# Patient Record
Sex: Female | Born: 1945
Health system: Southern US, Community
[De-identification: ages and names within clinical notes are randomized; demographics above are authoritative.]

## PROBLEM LIST (undated history)

## (undated) DIAGNOSIS — Z8619 Personal history of other infectious and parasitic diseases: Secondary | ICD-10-CM

## (undated) DIAGNOSIS — K5792 Diverticulitis of intestine, part unspecified, without perforation or abscess without bleeding: Secondary | ICD-10-CM

## (undated) HISTORY — DX: Personal history of other infectious and parasitic diseases: Z86.19

## (undated) HISTORY — PX: CHOLECYSTECTOMY: SHX55

## (undated) HISTORY — DX: Diverticulitis of intestine, part unspecified, without perforation or abscess without bleeding: K57.92

## (undated) HISTORY — PX: APPENDECTOMY: SHX54

---

## 1974-04-30 HISTORY — PX: ABDOMINAL HYSTERECTOMY: SHX81

## 1998-04-30 HISTORY — PX: FOOT SURGERY: SHX648

## 2002-04-30 HISTORY — PX: KNEE SURGERY: SHX244

## 2004-02-01 ENCOUNTER — Ambulatory Visit: Payer: Self-pay

## 2004-05-11 ENCOUNTER — Ambulatory Visit: Payer: Self-pay | Admitting: Specialist

## 2005-02-06 ENCOUNTER — Ambulatory Visit: Payer: Self-pay | Admitting: Family Medicine

## 2006-02-26 ENCOUNTER — Ambulatory Visit: Payer: Self-pay | Admitting: Family Medicine

## 2010-04-26 ENCOUNTER — Ambulatory Visit: Payer: Self-pay | Admitting: Family Medicine

## 2011-04-11 LAB — HM PAP SMEAR: HM Pap smear: NORMAL

## 2011-05-31 ENCOUNTER — Ambulatory Visit: Payer: Self-pay | Admitting: Family Medicine

## 2012-05-08 ENCOUNTER — Ambulatory Visit: Payer: Self-pay | Admitting: Family Medicine

## 2012-07-18 ENCOUNTER — Ambulatory Visit: Payer: Self-pay | Admitting: Family Medicine

## 2013-01-09 ENCOUNTER — Ambulatory Visit: Payer: Self-pay | Admitting: Gastroenterology

## 2013-01-09 HISTORY — PX: UPPER GI ENDOSCOPY: SHX6162

## 2013-01-09 HISTORY — PX: COLONOSCOPY: SHX174

## 2013-01-09 LAB — HM COLONOSCOPY

## 2013-01-12 LAB — PATHOLOGY REPORT

## 2013-01-16 ENCOUNTER — Ambulatory Visit: Payer: Self-pay | Admitting: Gastroenterology

## 2013-01-23 ENCOUNTER — Ambulatory Visit: Payer: Self-pay | Admitting: Gastroenterology

## 2013-02-03 ENCOUNTER — Encounter: Payer: Self-pay | Admitting: *Deleted

## 2013-02-18 ENCOUNTER — Ambulatory Visit (INDEPENDENT_AMBULATORY_CARE_PROVIDER_SITE_OTHER): Payer: Medicare Other | Admitting: General Surgery

## 2013-02-18 ENCOUNTER — Encounter: Payer: Self-pay | Admitting: General Surgery

## 2013-02-18 VITALS — BP 128/80 | HR 60 | Resp 16 | Ht 63.0 in | Wt 201.0 lb

## 2013-02-18 DIAGNOSIS — K5732 Diverticulitis of large intestine without perforation or abscess without bleeding: Secondary | ICD-10-CM

## 2013-02-18 DIAGNOSIS — K5792 Diverticulitis of intestine, part unspecified, without perforation or abscess without bleeding: Secondary | ICD-10-CM

## 2013-02-18 NOTE — Patient Instructions (Addendum)
The patient is aware to call back for any questions or concerns.  

## 2013-02-18 NOTE — Progress Notes (Signed)
Patient ID: Olivia [name], female   DOB: 1945/12/25, 67 y.o.   MRN: [id number]  Chief Complaint  Patient presents with  . Other    abnormal CT    HPI Olivia [name] is a 67 y.o. female.  Here today for evaluation of abnormal CT scan with abdominal pain referred by Dr. Ricki Rodriguez.  States that in January she had abdominal pain ( lower abdomen) that lasted about 7 days and then went away but that it started again in June. The pain was associated with vomiting during the July episode. She is presently asymptomatic.  She elected to seek GI evaluation after the second episode in the event something serious was going on.   States her bowels move regularly after each meal. No constipation or blood noted with bowel movements. Denies episodes of pain since June only occasional "cramp". She states she was treated for diverticulitis in 2007 but no test were done. Lost 25 pounds since September. She does states she has noticed more flatus since colonoscopy.  While she denies frank food avoidance, on further questioning she has been somewhat cautious with her dietary intake.  HPI  Past Medical History  Diagnosis Date  . Arthritis   . Hemorrhoid   . Diverticulosis 2014    Past Surgical History  Procedure Laterality Date  . Abdominal hysterectomy  1976  . Cholecystectomy  2003 ?  Marland Kitchen Foot surgery Right 2000  . Knee surgery Right 2004  . Colonoscopy  01-09-13    Dr Ricki Rodriguez  . Upper gi endoscopy  01-09-13    Dr Ricki Rodriguez  . Appendectomy      Family History  Problem Relation Age of Onset  . Alzheimer's disease Father     Social History History  Substance Use Topics  . Smoking status: Never Smoker   . Smokeless tobacco: Never Used  . Alcohol Use: Yes    No Known Allergies  Current Outpatient Prescriptions  Medication Sig Dispense Refill  . meloxicam (MOBIC) 15 MG tablet Take 15 mg by mouth daily.      . Multiple Vitamin (MULTIVITAMIN) capsule Take 1 capsule by mouth daily.      . polycarbophil  (FIBERCON) 625 MG tablet Take 625 mg by mouth daily.      . Probiotic Product (PHILLIPS COLON HEALTH PO) Take 1 tablet by mouth daily.      Marland Kitchen omeprazole (PRILOSEC) 20 MG capsule Take 20 mg by mouth daily.       . pantoprazole (PROTONIX) 40 MG tablet Take 40 mg by mouth 2 (two) times daily.        No current facility-administered medications for this visit.    Review of Systems Review of Systems  Constitutional: Negative.   Respiratory: Negative.   Cardiovascular: Negative.   Gastrointestinal: Positive for nausea, vomiting and abdominal pain. Negative for blood in stool.    Blood pressure 128/80, pulse 60, resp. rate 16, height 5\' 3"  (1.6 m), weight 201 lb (91.173 kg).  Physical Exam Physical Exam  Constitutional: She is oriented to person, place, and time. She appears well-developed and well-nourished.  Eyes: Conjunctivae are normal. No scleral icterus.  Neck: Neck supple.  Cardiovascular: Normal rate, regular rhythm and normal heart sounds.   No lower leg edema noted.  Pulmonary/Chest: Effort normal and breath sounds normal.  Abdominal: Soft. Bowel sounds are normal. She exhibits no mass. There is no tenderness.  Lymphadenopathy:    She has no cervical adenopathy.  Neurological: She is alert and oriented to person, place,  and time.  Skin: Skin is warm and dry.    Data Reviewed Vevelyn Pat,  NP notes from August 2014 during her initial evaluation. Weight loss was noted at that time.  Laboratory studies dated 12/01/2012 showed a hemoglobin of 12.2 with an MCV of 93, white blood cell count 6200 with a normal differential, platelet count 237,000. Normal liver function studies, albumin 4.0, normal renal function, estimated GFR greater than 60.  Upper endoscopy dated 02/08/2013 showed gastritis with the clean ulcer base in the stomach.  Pathology: Diagnosis: Part A: ANTRAL AND BODY OF STOMACH COLD BIOSPY: - ANTRAL MUCOSA WITH MINIMAL CHRONIC GASTRITIS AND  SUPERFICIAL VASCULAR CONGESTION WITH REACTIVE FOVEOLAR HYPERPLASIA (SEE COMMENT). - OXYNTIC MUCOSA WITH MINIMAL CHRONIC GASTRITIS. - NO HELICOBACTER PYLORI ARE IDENTIFIED ON HEMATOXYLIN AND EOSIN STAIN. - NEGATIVE FOR DYSPLASIA AND MALIGNANCY. . Part B: GEJ COLD BIOPSY: - GASTRIC TYPE MUCOSA WITH MILD TO MODERATE CHRONIC INFLAMMATION. - NO INTESTINAL METAPLASIA, DYSPLASIA OR MALIGNANCY IDENTIFIED. Marland Kitchen Comment: Findings the in antral mucosa are compatible with healing mucosal injury.  Possible etiologies include drug, chemical injury, infection and other upper GI process. Correlation with clinical and endoscopic findings is recommended.  Colonoscopy of the same date showed mild diverticulosis in the sigmoid colon with petechiae visualized at the diverticular opening. Passage of the sigmoid was not possible secondary to angulation.  Barium enema dated 01/16/2013, air contrast, her diffuse severe diverticulosis within the sigmoid colon and a lesser extent in the ascending colon. No luminal narrowing was identified in the midportion of the sigmoid colon. Irregularity in the wall was noted to distention of the colon was appreciated in this area differential diagnoses consisted of diverticulitis versus a mass.  CT scan of the abdomen and pelvis dated 01/23/2013 showed abnormal appearance of the sigmoid colon with thickening of the wall and narrowing of the lumen. This is poorly seen due to artifact from the previous administered barium in diverticuli in this region to underlying malignancy or chronic inflammation was thought to be most likely source of this finding.  The patient reports that his CEA had been drawn, but these results are not available.    Assessment    Diverticulosis/history diverticulitis; abnormal imaging.      Plan    The degree of thickening of the bowel wall and the sigmoid colon seems out of proportion for the patient's clinical history for 2014. The fairly uniform  segmental wall thickening would be typically more common with an inflammatory process rather than malignancy. The proximal dilatation is scant.  The patient is presently entirely asymptomatic, and the only true concern is her history of weight loss in the absence of any other clinical indicator of a pathologic process.  I would plan to repeat her CT in December, 2014 if she remains asymptomatic and the CEA obtained earlier this month is normal. Repeat attempts at visualization of the sigmoid colon would be considered based on the results of the followup CT scan.  The patient will be contacted with this plan and arrangements made for repeat scan in December.  She is encouraged to call this office should she develop any recurrent episodes of abdominal pain.     CEA levels were done at [facility] last week.   Earline Mayotte 02/20/2013, 4:37 PM

## 2013-02-20 ENCOUNTER — Telehealth: Payer: Self-pay | Admitting: General Surgery

## 2013-02-20 ENCOUNTER — Encounter: Payer: Self-pay | Admitting: General Surgery

## 2013-02-20 DIAGNOSIS — K5792 Diverticulitis of intestine, part unspecified, without perforation or abscess without bleeding: Secondary | ICD-10-CM

## 2013-02-20 HISTORY — DX: Diverticulitis of intestine, part unspecified, without perforation or abscess without bleeding: K57.92

## 2013-02-20 NOTE — Telephone Encounter (Signed)
Left a message for the patient that we're still waiting for some final pieces of information from the referring physician's office.

## 2013-02-23 ENCOUNTER — Encounter: Payer: Self-pay | Admitting: *Deleted

## 2013-02-23 ENCOUNTER — Telehealth: Payer: Self-pay | Admitting: *Deleted

## 2013-02-23 DIAGNOSIS — K5732 Diverticulitis of large intestine without perforation or abscess without bleeding: Secondary | ICD-10-CM

## 2013-02-23 NOTE — Telephone Encounter (Signed)
Patient has been scheduled for a CT abdomen/pelvis with contrast at [facility] for 04-02-13 at 1 pm (arrive 12:45 pm). Prep: no solids 4 hours prior but patient may have clear liquids up until exam time, pick up prep kit, and take medication list. Patient verbalizes understanding.  She will follow up in the office on 04-09-13 at 10 am.

## 2013-02-23 NOTE — Telephone Encounter (Signed)
Message copied by Nicholes Mango on Mon Feb 23, 2013 11:58 AM ------      Message from: [place], Merrily Pew      Created: Fri Feb 20, 2013  4:49 PM       Arrange for CT abd and pelvis; f/u diverticulitis in Dec 2014 w/ OV to follow.        ------

## 2013-03-23 ENCOUNTER — Telehealth: Payer: Self-pay

## 2013-03-23 NOTE — Telephone Encounter (Signed)
Patient called and would like to know if we could call her in a refill of her Protonix 40 mg. She does not have enough to last until her follow up appointment with [place] on the 15th of December. She is aware that this may not be taken care of until the 1st of December. She said she has enough to last until December 5th.

## 2013-03-30 ENCOUNTER — Telehealth: Payer: Self-pay

## 2013-03-30 NOTE — Telephone Encounter (Signed)
Patient notified that she would have to have Dr Orpah Cobb office refill her prescription for Protonix. She is aware and will contact them for this.

## 2013-03-30 NOTE — Telephone Encounter (Signed)
Message copied by Sinda Du on Mon Mar 30, 2013  8:44 AM ------      Message from: [place], Merrily Pew      Created: Mon Mar 30, 2013  8:19 AM       Original RX for Protonix was from another office. She should contact that office to arrange for refill. Thanks.  ------

## 2013-04-02 ENCOUNTER — Ambulatory Visit: Payer: Self-pay | Admitting: General Surgery

## 2013-04-03 ENCOUNTER — Encounter: Payer: Self-pay | Admitting: General Surgery

## 2013-04-09 ENCOUNTER — Ambulatory Visit (INDEPENDENT_AMBULATORY_CARE_PROVIDER_SITE_OTHER): Payer: Medicare Other | Admitting: General Surgery

## 2013-04-09 ENCOUNTER — Ambulatory Visit: Payer: Medicare Other | Admitting: General Surgery

## 2013-04-09 VITALS — BP 140/78 | HR 78 | Resp 14 | Ht 63.0 in | Wt 203.0 lb

## 2013-04-09 DIAGNOSIS — K5732 Diverticulitis of large intestine without perforation or abscess without bleeding: Secondary | ICD-10-CM

## 2013-04-09 NOTE — Progress Notes (Signed)
Patient ID: Elantra Caprara, female   DOB: 1946-01-19, 67 y.o.   MRN: [id number]  Chief Complaint  Patient presents with  . Follow-up    HPI Lashica Hannay is a 67 y.o. female  Here for a follow up from a CT of the abdomen and pelvis. She states that she is doing very well. The patient reports that in the past she would have a stool after each meal. This was not associated with any pain and a formed stool was noted. Since her recent lower abdominal discomfort, she will still have a bowel movement after each meal, although this may occur up to an hour later. The stools are somewhat less formed, although certainly not loose. No mucus or blood reported.  Her energy level is good. Her weight is up to #surgeon in October. HPI  Past Medical History  Diagnosis Date  . Arthritis   . Hemorrhoid   . Diverticulosis 2014    Past Surgical History  Procedure Laterality Date  . Abdominal hysterectomy  1976  . Cholecystectomy  2003 ?  Marland Kitchen Foot surgery Right 2000  . Knee surgery Right 2004  . Colonoscopy  01-09-13    Dr Ricki Rodriguez  . Upper gi endoscopy  01-09-13    Dr Ricki Rodriguez  . Appendectomy      Family History  Problem Relation Age of Onset  . Alzheimer's disease Father     Social History History  Substance Use Topics  . Smoking status: Never Smoker   . Smokeless tobacco: Never Used  . Alcohol Use: Yes    No Known Allergies  Current Outpatient Prescriptions  Medication Sig Dispense Refill  . meloxicam (MOBIC) 15 MG tablet Take 15 mg by mouth daily.      . Multiple Vitamin (MULTIVITAMIN) capsule Take 1 capsule by mouth daily.      Marland Kitchen omeprazole (PRILOSEC) 20 MG capsule Take 20 mg by mouth daily.       . polycarbophil (FIBERCON) 625 MG tablet Take 625 mg by mouth daily.      . Probiotic Product (PHILLIPS COLON HEALTH PO) Take 1 tablet by mouth daily.      . pantoprazole (PROTONIX) 40 MG tablet Take 40 mg by mouth 2 (two) times daily.        No current facility-administered medications for  this visit.    Review of Systems Review of Systems  Constitutional: Negative.   Respiratory: Negative.   Cardiovascular: Negative.     Blood pressure 140/78, pulse 78, resp. rate 14, height 5\' 3"  (1.6 m), weight 203 lb (92.08 kg).  Physical Exam Physical Exam  Constitutional: She is oriented to person, place, and time. She appears well-developed and well-nourished.  Neck: Neck supple.  Pulmonary/Chest: Effort normal and breath sounds normal.  Abdominal: Soft. Normal appearance and bowel sounds are normal. There is no hepatosplenomegaly. There is no tenderness. No hernia.    Lymphadenopathy:    She has no cervical adenopathy.  Neurological: She is alert and oriented to person, place, and time.    Data Reviewed CT scan completed April 02, 2013 was reviewed and compared to her January 23, 2013 exam. Significant findings are confined to the sigmoid where there is abnormal circumferential wall thickening. No lymphadenopathy. Small hiatal hernia with mild distal esophageal wall thickening.  A CT as well as the barium enema completed prior to her assessment here in October 2014 was reviewed with the patient did show her the luminal narrowing of the period  Assessment    Likely  chronic diverticulitis with a fibrosis and scarring accounting for the CT findings.  The possibility of malignancy cannot be excluded based on imaging today.     Plan    CEA was previously obtained was normal. The area could not be visualized on colonoscopy by Dr. Marva Panda secondary to tortuosity in the area. Very minimal a showed no focal abnormality, but a significant decrease in diameter. There is really no proximal bowel dilatation to suggest obstruction.  My main concern is with a significant luminal decrease, unchanged over the past 3 months the patient may be prone to frank obstruction should she develop another episode of diverticulitis in this segment of colon studded with diverticuli. Elective  resection of this area was reviewed. The risks associated with surgery including those related to bleeding, infection, leakage, the need for a temporary colostomy were all reviewed. An informational brochure was provided  The patient consider her options notify the office of how she would like to proceed. The opportunity for second surgical opinion was discussed. She may want to review in person with Dr. Marva Panda these recommendations.        Earline Mayotte 04/09/2013, 8:09 PM

## 2013-04-09 NOTE — Patient Instructions (Signed)
Open Colectomy An open colectomy is surgery to take out part or all of the large intestine (colon).  LET YOUR HEALTH CARE PROVIDER KNOW ABOUT:  Any allergies you have.  All medicines you are taking, including vitamins, herbs, eyedrops, creams, and over-the-counter medicines.  Previous problems you or members of your family have had with the use of anesthetics.  Any blood disorders you have.  Previous surgeries you have had.  Medical conditions you have.  Possibility of pregnancy, if this applies.  Any recent infections or colds.  Smoking history. RISKS AND COMPLICATIONS  Generally, this is a safe procedure. However, as with any procedure, complications can occur. Possible complications include:  An infection developing in the area where the surgery was done.  Problems with the incisions including:  Bleeding from an incision.  The wound reopening.  Tissues from inside the abdomen bulging through the incision (hernia).  Bleeding inside the abdomen.  Reopening of the colon where it was stitched or stapled together. This is a serious complication. Another procedure may be needed to fix the problem.  Damage to other organs in the abdomen.  A blood clot forming in a vein and traveling to the lungs.  Future blockage of the colon. BEFORE THE PROCEDURE  A medical evaluation will be done. This may include:  A physical exam.  Blood tests.  A test to check the heart's rhythm (electrocardiogram).  An MRI. This can take pictures of the colon. An MRI uses a magnet, radio waves, and a computer to create a picture of your colon.  Talking with the person who will be in charge of the medicine during the procedure. An open colon resection requires general anesthesia. Ask what you can expect.  Two weeks before the surgery, stop using aspirin and nonsteroidal anti-inflammatory drugs (NSAIDs) for pain relief. This includes prescription drugs and over-the-counter drugs. Also stop  taking vitamin E.  If you take blood thinners, ask your health care provider when you should stop taking them.  Do not eat or drink anything for 8 12 hours before the surgery. Ask your health care provider if it is okay to take any needed medicines with a sip of water.  Ask your health care provider if you need to arrive early before the procedure.  On the day of your surgery, your health care provider will need to know the last time you had anything to eat or drink. This includes water, gum, and candy.  Make arrangements in advance for someone to drive you home. PROCEDURE Colon resection can take 1 4 hours.  Small monitors will be put on your body. They are used to check your heart, blood pressure, and oxygen level.  You will be given an intravenous line (IV). A needle will be inserted in your arm. Medicine will be able to flow directly into your body through this needle.  You might be given a medicine to help you relax (sedative).  You will be given a general anesthetic.  A tube may be put in through your nose. It is called a nasogastric tube. It is used to remove stomach juices after surgery until the intestines start working again.  Once you are asleep, the surgeon will make an incision in the abdomen about 6 12 inches long.  Clamps are put on both ends of the diseased part of the colon.  The part of the intestine between the clamps is removed.  If possible, the ends of the healthy colon that remain will be stitched   or stapled together.  Sometimes, a colostomy is needed. For a colostomy:  An opening (stoma) to the outside is made through the abdomen.  The end of the colon is brought through the opening. It is stitched to the skin.  A bag is attached to the opening. Waste will drain into this bag. The bag is removable.  The colostomy can be temporary or permanent. Ask your surgeon what to expect.  The incision from the colon resection will be closed with stitches or  staples. AFTER THE PROCEDURE  You will stay in a recovery area until the anesthesia has worn off. Your blood pressure and pulse will be checked every so often. Then you will be taken to a hospital room.  You will continue to get fluids through the IV for awhile. The IV will be taken out when the colon starts working again.  You will gradually go back to a normal diet.  Some pain is normal after a colon resection. Ask for pain medicine if the pain becomes too much.  You will be urged to get up and start walking after 1 or 2 days, at the most.  If you had a colostomy, your health care provider will explain how it works and what you will need to do.  Most people spend 3 7 days in the hospital after this surgery. Ask your health care provider what to expect. Document Released: 02/11/2009 Document Revised: 12/17/2012 Document Reviewed: 09/01/2010 [employer] Patient Information 2014 [employer].  

## 2013-04-13 ENCOUNTER — Ambulatory Visit: Payer: Medicare Other | Admitting: General Surgery

## 2013-05-05 ENCOUNTER — Telehealth: Payer: Self-pay | Admitting: *Deleted

## 2013-05-05 DIAGNOSIS — K5732 Diverticulitis of large intestine without perforation or abscess without bleeding: Secondary | ICD-10-CM

## 2013-05-05 MED ORDER — NEOMYCIN SULFATE 500 MG PO TABS: ORAL_TABLET | ORAL | Status: DC

## 2013-05-05 MED ORDER — POLYETHYLENE GLYCOL 3350 17 GM/SCOOP PO POWD: ORAL | Status: DC

## 2013-05-05 MED ORDER — METRONIDAZOLE 500 MG PO TABS: ORAL_TABLET | ORAL | Status: DC

## 2013-05-05 NOTE — Telephone Encounter (Signed)
Patient's surgery has been scheduled for 05-15-13 at [facility].  Further instructions and bowel prep will need to be reviewed with patient at pre-op visit. Prescription for bowel prep has been sent in to her pharmacy.  It is okay for patient to continue Meloxicam pre-op.

## 2013-05-07 ENCOUNTER — Ambulatory Visit (INDEPENDENT_AMBULATORY_CARE_PROVIDER_SITE_OTHER): Payer: Medicare Other | Admitting: General Surgery

## 2013-05-07 ENCOUNTER — Encounter: Payer: Self-pay | Admitting: General Surgery

## 2013-05-07 ENCOUNTER — Other Ambulatory Visit: Payer: Self-pay | Admitting: General Surgery

## 2013-05-07 ENCOUNTER — Ambulatory Visit: Payer: Self-pay | Admitting: General Surgery

## 2013-05-07 VITALS — BP 152/82 | HR 80 | Resp 14 | Ht 67.0 in | Wt 203.0 lb

## 2013-05-07 DIAGNOSIS — K5732 Diverticulitis of large intestine without perforation or abscess without bleeding: Secondary | ICD-10-CM

## 2013-05-07 DIAGNOSIS — Z01810 Encounter for preprocedural cardiovascular examination: Secondary | ICD-10-CM

## 2013-05-07 NOTE — Patient Instructions (Addendum)
The patient is aware to call back for any questions or concerns. Monostat for current fungal/yeast rash use twice a day.

## 2013-05-07 NOTE — Progress Notes (Signed)
Patient ID: Olivia [name], female   DOB: 1946-04-19, 68 y.o.   MRN: [id number]  Chief Complaint  Patient presents with  . Pre-op Exam    colon resection    HPI Daris Aristizabal is a 68 y.o. female.  Here today for pre op colon resection scheduled for 05-15-13. States she may have a little sinus drainage today. HPI  Past Medical History  Diagnosis Date  . Arthritis   . Hemorrhoid   . Diverticulosis 2014    Past Surgical History  Procedure Laterality Date  . Abdominal hysterectomy  1976  . Cholecystectomy  2003 ?  Marland Kitchen Foot surgery Right 2000  . Knee surgery Right 2004  . Colonoscopy  01-09-13    Dr Donnella Sham  . Upper gi endoscopy  01-09-13    Dr Donnella Sham  . Appendectomy      Family History  Problem Relation Age of Onset  . Alzheimer's disease Father     Social History History  Substance Use Topics  . Smoking status: Never Smoker   . Smokeless tobacco: Never Used  . Alcohol Use: Yes    No Known Allergies  Current Outpatient Prescriptions  Medication Sig Dispense Refill  . Ascorbic Acid (VITAMIN C) 100 MG tablet Take 100 mg by mouth daily.      . meloxicam (MOBIC) 15 MG tablet Take 15 mg by mouth daily.      . metroNIDAZOLE (FLAGYL) 500 MG tablet Take (1) one tablet at 6 pm and (1) one tablet at 11 pm the evening prior to surgery.  2 tablet  0  . Multiple Vitamin (MULTIVITAMIN) capsule Take 1 capsule by mouth daily.      Marland Kitchen neomycin (MYCIFRADIN) 500 MG tablet Take (2) tablets at 6 pm and (2) tablets at 11 pm the evening prior to surgery.  4 tablet  0   No current facility-administered medications for this visit.    Review of Systems Review of Systems  Constitutional: Negative.   Respiratory: Negative.   Cardiovascular: Negative.     Blood pressure 152/82, pulse 80, resp. rate 14, height 5\' 7"  (1.702 m), weight 203 lb (92.08 kg).  Physical Exam Physical Exam  Constitutional: She is oriented to person, place, and time. She appears well-developed and well-nourished.   Eyes: No scleral icterus.  Neck: Neck supple.  Cardiovascular: Normal rate and regular rhythm.   Murmur heard.  Systolic murmur is present with a grade of 1/6  Over aortic outflow tract, not previously noted.   Pulmonary/Chest: Effort normal and breath sounds normal.  Abdominal: Soft. Normal appearance and bowel sounds are normal. There is no tenderness. A hernia is present.  Well healed lower midline abdominal incision. Small rash suprapubic region.  Lymphadenopathy:    She has no cervical adenopathy.  Neurological: She is alert and oriented to person, place, and time.  Skin: Skin is warm and dry.    Data Reviewed December 2014 CT shows persistent narrowing of the mid sigmoid colon.  Assessment    Likely chronic diverticulitis with scarring versus invasive malignancy    Plan    Discussed risk and benefits associated with colon resection. Monostat for current fungal/yeast rash in the suprapubic area to be used twice a day. Instructions for preparation prior surgery were reviewed with the patient.        Robert Bellow 05/07/2013, 11:31 AM

## 2013-05-15 ENCOUNTER — Inpatient Hospital Stay: Payer: Self-pay | Admitting: General Surgery

## 2013-05-15 DIAGNOSIS — K5732 Diverticulitis of large intestine without perforation or abscess without bleeding: Secondary | ICD-10-CM

## 2013-05-15 HISTORY — PX: COLON SURGERY: SHX602

## 2013-05-16 LAB — CREATININE, SERUM
Creatinine: 0.81 mg/dL (ref 0.60–1.30)
EGFR (African American): >60
EGFR (Non-African Amer.): >60

## 2013-05-18 ENCOUNTER — Encounter: Payer: Self-pay | Admitting: General Surgery

## 2013-05-18 LAB — PATHOLOGY REPORT

## 2013-05-19 ENCOUNTER — Encounter: Payer: Self-pay | Admitting: General Surgery

## 2013-05-19 LAB — WOUND CULTURE

## 2013-05-22 ENCOUNTER — Ambulatory Visit (INDEPENDENT_AMBULATORY_CARE_PROVIDER_SITE_OTHER): Payer: Medicare Other | Admitting: *Deleted

## 2013-05-22 DIAGNOSIS — K5732 Diverticulitis of large intestine without perforation or abscess without bleeding: Secondary | ICD-10-CM

## 2013-05-22 NOTE — Progress Notes (Signed)
The staples were removed and steri strips applied.Area looks clean .

## 2013-05-27 ENCOUNTER — Encounter: Payer: Self-pay | Admitting: General Surgery

## 2013-05-27 ENCOUNTER — Ambulatory Visit (INDEPENDENT_AMBULATORY_CARE_PROVIDER_SITE_OTHER): Payer: Medicare Other | Admitting: General Surgery

## 2013-05-27 VITALS — BP 144/68 | HR 84 | Resp 14 | Ht 63.0 in | Wt 194.0 lb

## 2013-05-27 DIAGNOSIS — K5732 Diverticulitis of large intestine without perforation or abscess without bleeding: Secondary | ICD-10-CM

## 2013-05-27 NOTE — Progress Notes (Signed)
Patient ID: Olivia [name], female   DOB: 02-16-1946, 68 y.o.   MRN: [id number]  Chief Complaint  Patient presents with  . Routine Post Op    colon resection     HPI Olivia [name] is a 68 y.o. female who presents for a post op colon resection. The procedure was performed on 05/15/13. The patient states she is doing well. Her bowels are starting to be more formed at this time.  Bowel movements are a little bit irregular, occasional loose stools. No blood or mucus. No dietary intolerance. Minimal abdominal soreness.  HPI  Past Medical History  Diagnosis Date  . Arthritis   . Hemorrhoid   . Diverticulosis 2014    Past Surgical History  Procedure Laterality Date  . Abdominal hysterectomy  1976  . Cholecystectomy  2003 ?  Olivia [name] Foot surgery Right 2000  . Knee surgery Right 2004  . Colonoscopy  01-09-13    Dr Donnella Sham  . Upper gi endoscopy  01-09-13    Dr Donnella Sham  . Appendectomy    . Colon surgery  05/15/13    Sigmoid colectomy for focal diverticulosis/diverticulitis with contained abscess.    Family History  Problem Relation Age of Onset  . Alzheimer's disease Father     Social History History  Substance Use Topics  . Smoking status: Never Smoker   . Smokeless tobacco: Never Used  . Alcohol Use: Yes    No Known Allergies  Current Outpatient Prescriptions  Medication Sig Dispense Refill  . Ascorbic Acid (VITAMIN C) 100 MG tablet Take 100 mg by mouth daily.      . meloxicam (MOBIC) 15 MG tablet Take 15 mg by mouth daily.      . metroNIDAZOLE (FLAGYL) 500 MG tablet Take (1) one tablet at 6 pm and (1) one tablet at 11 pm the evening prior to surgery.  2 tablet  0  . Multiple Vitamin (MULTIVITAMIN) capsule Take 1 capsule by mouth daily.      Olivia [name] neomycin (MYCIFRADIN) 500 MG tablet Take (2) tablets at 6 pm and (2) tablets at 11 pm the evening prior to surgery.  4 tablet  0   No current facility-administered medications for this visit.    Review of Systems Review of Systems   Constitutional: Negative.   Respiratory: Negative.   Cardiovascular: Negative.   Gastrointestinal: Negative.     Blood pressure 144/68, pulse 84, resp. rate 14, height 5\' 3"  (1.6 m), weight 194 lb (87.998 kg).  Physical Exam Physical Exam  Constitutional: She is oriented to person, place, and time. She appears well-developed and well-nourished.  Cardiovascular: Normal rate, regular rhythm and normal heart sounds.   No murmur heard. Pulmonary/Chest: Effort normal and breath sounds normal.  Abdominal: Soft. Normal appearance and bowel sounds are normal. There is no tenderness.  The midline wound as well as the port sites are healing without evidence of infection or inflammation. No weakness.  Neurological: She is alert and oriented to person, place, and time.  Skin: Skin is warm and dry.    Data Reviewed Pathology showed focal diverticulitis with contained abscess and marked fibrosis. This would account for the thickened appearance on CT and difficulty to pass the area at the time of endoscopy.  Assessment    Doing well status post sigmoid resection and primary anastomosis.     Plan    The patient will increase her activity as tolerated. We'll plan for followup examination here in one month.  Olivia [name] 05/27/2013, 9:57 PM

## 2013-05-27 NOTE — Patient Instructions (Signed)
Patient to return in 1 month. Patient to contact our office with any questions or concerns.

## 2013-06-25 ENCOUNTER — Ambulatory Visit: Payer: Medicare Other | Admitting: General Surgery

## 2013-07-14 ENCOUNTER — Encounter: Payer: Self-pay | Admitting: General Surgery

## 2013-07-14 ENCOUNTER — Ambulatory Visit (INDEPENDENT_AMBULATORY_CARE_PROVIDER_SITE_OTHER): Payer: Medicare Other | Admitting: General Surgery

## 2013-07-14 VITALS — BP 138/78 | HR 70 | Resp 12 | Ht 63.0 in | Wt 201.0 lb

## 2013-07-14 DIAGNOSIS — K5732 Diverticulitis of large intestine without perforation or abscess without bleeding: Secondary | ICD-10-CM

## 2013-07-14 NOTE — Progress Notes (Signed)
Patient ID: Olivia [name], female   DOB: May 24, 1945, 68 y.o.   MRN: [id number]  Chief Complaint  Patient presents with  . Routine Post Op    colon resection    HPI Olivia [name] is a 68 y.o. female here today for her follow up colon resection which was done on 05/15/13.Patient states she is doing well. No gastrointestinal issues.   HPI  Past Medical History  Diagnosis Date  . Arthritis   . Hemorrhoid   . Diverticulosis 2014    Past Surgical History  Procedure Laterality Date  . Abdominal hysterectomy  1976  . Cholecystectomy  2003 ?  Marland Kitchen Foot surgery Right 2000  . Knee surgery Right 2004  . Colonoscopy  01-09-13    Dr Donnella Sham  . Upper gi endoscopy  01-09-13    Dr Donnella Sham  . Appendectomy    . Colon surgery  05/15/13    Sigmoid colectomy for focal diverticulosis/diverticulitis with contained abscess.    Family History  Problem Relation Age of Onset  . Alzheimer's disease Father     Social History History  Substance Use Topics  . Smoking status: Never Smoker   . Smokeless tobacco: Never Used  . Alcohol Use: Yes    No Known Allergies  Current Outpatient Prescriptions  Medication Sig Dispense Refill  . acetaminophen (TYLENOL) 325 MG tablet Take 650 mg by mouth every 6 (six) hours as needed.      . Ascorbic Acid (VITAMIN C) 100 MG tablet Take 100 mg by mouth daily.      . meloxicam (MOBIC) 15 MG tablet Take 15 mg by mouth daily.      . Multiple Vitamin (MULTIVITAMIN) capsule Take 1 capsule by mouth daily.       No current facility-administered medications for this visit.    Review of Systems Review of Systems  Constitutional: Negative.   Respiratory: Negative.   Cardiovascular: Negative.   Gastrointestinal: Negative for nausea, vomiting, diarrhea, constipation and blood in stool.    Blood pressure 138/78, pulse 70, resp. rate 12, height 5\' 3"  (1.6 m), weight 201 lb (91.173 kg).  Physical Exam Physical Exam  Constitutional: She is oriented to person, place,  and time. She appears well-developed and well-nourished.  Cardiovascular: Normal rate, regular rhythm and normal heart sounds.   Pulses:      Femoral pulses are 2+ on the right side, and 2+ on the left side. Pulmonary/Chest: Effort normal and breath sounds normal.  Abdominal: Soft. Normal appearance and bowel sounds are normal. There is no tenderness. Hernia confirmed negative in the ventral area.  Well healed lower midline incision and port site  Lymphadenopathy:       Right: No inguinal adenopathy present.       Left: No inguinal adenopathy present.  Neurological: She is alert and oriented to person, place, and time.  Skin: Skin is warm.    Data Reviewed Pathology showed diverticulitis with the healing diverticular abscess.Clinical examination of the specimen showed stricture formation.  Assessment    Doing well status post sigmoid colectomy.     Plan    The patient is released to full activity. Care was strenuous lifting was encouraged. Proper lifting technique was demonstrated. Follow up will be on an as needed basis.        Robert Bellow 07/15/2013, 6:20 AM

## 2013-07-14 NOTE — Patient Instructions (Signed)
The patient is aware to call back for any questions or concerns.  

## 2014-03-01 ENCOUNTER — Encounter: Payer: Self-pay | Admitting: General Surgery

## 2014-04-07 ENCOUNTER — Ambulatory Visit: Payer: Self-pay | Admitting: Family Medicine

## 2014-04-07 LAB — BASIC METABOLIC PANEL
BUN: 15 mg/dL (ref 4–21)
CREATININE: 0.8 mg/dL (ref 0.5–1.1)
Glucose: 85 mg/dL
POTASSIUM: 4.4 mmol/L (ref 3.4–5.3)
Sodium: 142 mmol/L (ref 137–147)

## 2014-04-07 LAB — HM DEXA SCAN: HM DEXA SCAN: NORMAL

## 2014-04-07 LAB — LIPID PANEL
CHOLESTEROL: 185 mg/dL (ref 0–200)
HDL: 50 mg/dL (ref 35–70)
LDL Cholesterol: 103 mg/dL
Triglycerides: 160 mg/dL (ref 40–160)

## 2014-04-07 LAB — HM MAMMOGRAPHY: HM Mammogram: NEGATIVE

## 2014-05-26 ENCOUNTER — Other Ambulatory Visit: Payer: Self-pay | Admitting: *Deleted

## 2014-05-26 DIAGNOSIS — K5732 Diverticulitis of large intestine without perforation or abscess without bleeding: Secondary | ICD-10-CM

## 2014-06-30 DIAGNOSIS — M25569 Pain in unspecified knee: Secondary | ICD-10-CM | POA: Diagnosis not present

## 2014-06-30 DIAGNOSIS — M25462 Effusion, left knee: Secondary | ICD-10-CM | POA: Diagnosis not present

## 2014-06-30 DIAGNOSIS — M170 Bilateral primary osteoarthritis of knee: Secondary | ICD-10-CM | POA: Diagnosis not present

## 2014-06-30 DIAGNOSIS — M1711 Unilateral primary osteoarthritis, right knee: Secondary | ICD-10-CM | POA: Diagnosis not present

## 2014-06-30 DIAGNOSIS — M1712 Unilateral primary osteoarthritis, left knee: Secondary | ICD-10-CM | POA: Diagnosis not present

## 2014-06-30 DIAGNOSIS — M25461 Effusion, right knee: Secondary | ICD-10-CM | POA: Diagnosis not present

## 2014-08-21 NOTE — Discharge Summary (Signed)
PATIENT NAME:  Olivia [name], Olivia [name] MR#:  [id number] DATE OF BIRTH:  [birth year]  DATE OF ADMISSION:  05/15/2013 DATE OF DISCHARGE:  05/19/2013  DISCHARGE DIAGNOSIS: Diverticulitis with focal abscess.   CLINICAL NOTE: This 69 year old woman had had an episode of abdominal pain and fall in 2014 and CT scan at that time showed marked thickening of the sigmoid colon. Subsequent evaluation with colonoscopy and barium enema showed an inability to pass the area of swelling. The patient had no symptoms at that time and a followup scan was obtained 3 months later. She remained asymptomatic during this interval, but the area of marked focal thickening in the mid sigmoid colon remained. She was felt to be a candidate for resection with a probability that this was a residual fibrosis and stricture from diverticulitis rather than malignancy.   The patient was taken to the operating room on May 15, 2013, at which time she underwent a laparoscopically-assisted takedown of the splenic flexure and sigmoid colectomy. The resected specimens suggested chronic diverticulitis and this was confirmed on pathology with evidence of a contained abscess. The patient's postoperative course was unremarkable. She was maintained on Entereg preoperatively and postoperatively and had prompt resumption of bowel function on postoperative day 2. She was started on clear liquids the day of surgery and advanced to a soft diet. She ambulated early and often. She showed no evidence of cardiopulmonary compromise. Her vital signs remained stable through her hospitalization and by postoperative day 4, she was felt to be a candidate for discharge home.   SUMMARY OF LABORATORY STUDIES: Culture of a small fluid collection in the lateral pelvic side wall showed no growth in 4 days, no white cells and no organisms. The pathology specimen showed diverticulitis with pericolic abscess, fibrosis and giant cell response consistent with resolving rupture. Dense  serosal adhesions. Margins of resection were uninvolved by inflammatory process. Creatinine was normal at 0.8 with an estimated GFR greater than 60.   The patient was discharged home to resume her regular medications and was given a prescription for Percocet 5/325 to use 1 q.4 hours p.r.n. for pain if needed.   Arrangements have been made for followup in my office in 7 to 10 days.  ____________________________ Robert Bellow, MD jwb:aw D: 05/25/2013 08:37:28 ET T: 05/25/2013 09:38:04 ET JOB#: [id number]  cc: Robert Bellow, MD, <Dictator> Lollie Sails, MD Kirstie Peri. Caryn Section, MD Cammi Consalvo Amedeo Kinsman MD ELECTRONICALLY SIGNED 05/25/2013 13:51

## 2014-08-21 NOTE — Op Note (Signed)
PATIENT NAME:  Olivia [name], Olivia [name] MR#:  [id number] DATE OF BIRTH:  1946-04-15  DATE OF PROCEDURE:  05/15/2013  PREOPERATIVE DIAGNOSIS: Suspected stricture of the colon secondary to diverticulitis.   POSTOPERATIVE DIAGNOSIS: Suspected stricture of the colon secondary to diverticulitis.   OPERATIVE PROCEDURE: Laparoscopic-assisted sigmoid resection with takedown of splenic flexure.   SURGEON: Hervey Ard, MD   ASSISTANT: Mckinley Jewel, MD   ANESTHESIA: General endotracheal under Dr. Ronelle Nigh.   ESTIMATED BLOOD LOSS: 200 mL.   URINE OUTPUT: 350 mL.  FLUID REPLACEMENT: Crystalloid.   CLINICAL NOTE: This 69 year old woman had an episode of abdominal pain and fall in 2014 and at that time a CT scan showed marked thickening of the sigmoid colon. Attempted colonoscopy was unsuccessful due to a tortuous rectosigmoid and subsequent barium enema suggested narrowing of the lumen in the sigmoid. At the time of original assessment 3 months ago, the patient was asymptomatic and a CEA was normal. A follow-up CT scan showed persistent thickening of the colonic wall in the sigmoid and it was felt reasonable to proceed to local resection to minimize the risk of obstruction or missed malignancy.   The patient underwent a formal bowel prep, including oral antibiotics. She received Invanz prior to the procedure. Pneumatic compression and DVT TED stockings were utilized. A Foley catheter was placed by the nurse after induction of general endotracheal anesthesia.   OPERATIVE NOTE: With the patient under adequate general endotracheal anesthesia and in the dorsal lithotomy position on a beanbag the abdomen was prepped with ChloraPrep and the perineum prepped with Betadine. Ioban drape was applied followed by the remaining drapes. A Veress needle was passed through a midline incision approximately 4 cm cephalad to the umbilicus. After assuring intra-abdominal location with the hanging drop test, the abdomen was  insufflated with CO2 at 12 mmHg pressure. A 10 mm step port was expanded and inspection showed no evidence of injury from initial port placement. There was noted to be adhesions in the left lower quadrant as well as along the midline from a previous hysterectomy. A 12 mm Xcel port was placed into the right lower quadrant and a 10 mm step port placed into the right upper quadrant. The Olympus thunderbolt energy device was used to divide the adhesions to the anterior abdominal wall. The patient was rolled to the right and evaluation of the pelvis showed an inflammatory process against the left pelvic side wall. The remaining abdomen was unremarkable. The peritoneal reflection was opened at the level of the descending/sigmoid junction and this was mobilized dividing the white line of Toldt with the energy source. This was extended around and the splenic flexure was mobilized. After assuring free mobilization and finding dense adhesions of the sigmoid to the left side wall it was elected to open the abdomen. The abdomen was desufflated and ports removed. A lower midline incision was made through the previous hysterectomy scar with the skin incised sharply and the remaining dissection completed with electrocautery. A small thermal injury to the small bowel on opening was covered with a 3-0 silk figure-of-eight suture. This was felt to be a serosal injury only. The small bowel was packed cephalad and using blunt dissection the sigmoid colon could be mobilized from the inflammatory process in the left pelvis. Of note, during the laparoscopic portion, a small collection of fluid was entered and this was thought likely related to the left ovary and a cyst. A culture was obtained. The colon was brought to the midline and the  course of the left ureter verified. The upper rectum was mobilized and the distal line of transection was at the upper level of the rectum and the proximal line of resection was in the mid sigmoid where  the colon was perfectly soft. The bowel was divided sharply and the mesentery divided with the energy source. The specimen was opened and the mucosa was entirely normal suggesting that the thickening and narrowing, especially in the mid sigmoid portion, was secondary to diverticulitis. The rectum was noted to be somewhat tortuous on CT imaging and it was thought unlikely to be able to pass a stapler from below. It was elected to make use of a 2 layer handsewn anastomosis. An outer seromuscular layer of 3-0 silks was used as well as a full-thickness running locking suture of 3-0 Vicryl for the full thickness closure. A canal suture was used on the anterior wall of the colon. The completion layer of seromuscular sutures were placed with silk. On examining the posterior aspect of the anastomosis, there was a question of a small diverticulum outside of the anastomotic area. This was mobilized freed and oversewn with a 3-0 silk figure-of-eight stitch. The pelvis was filled with water and the proximal bowel clamped. Sigmoidoscope was inserted and the anastomosis pressure tested with no evidence of leakage. The surgical instruments and wound protector were removed at this time and the wound was recovered with fresh towels and the surgeon's gowns and gloves were changed. A small area of bleeding from the right ovary which had been mobilized off the mesentery was controlled with 3-0 silk suture. This accounted for the majority of the blood loss during the procedure. Inspection showed no evidence of ongoing bleeding and good hemostasis. The left ureter was again found to be healthy and undamaged. There was no evidence of bleeding from the left upper quadrant. The abdomen was irrigated with warm saline solution. The peritoneum was closed with a running 2-0 Vicryl. The fascia was closed with 0 Maxon figure-of-eight sutures. The adipose layer was approximated with a running 2-0 Vicryl suture and the skin closed with staples.    Prior to closure of the fascia, the 12 mm port site was closed internally with a 2-0 Vicryl figure-of-eight suture. The 10 mm midline port was covered by the falciform ligament and the right upper quadrant wound could not be reached.   Dry dressings were applied and the patient was taken to the recovery room in stable condition.  ____________________________ Robert Bellow, MD jwb:sb D: 05/15/2013 16:03:20 ET T: 05/15/2013 17:06:15 ET JOB#: [id number]  cc: Robert Bellow, MD, <Dictator> Kirstie Peri. Caryn Section, MD Lollie Sails, MD Lexton Hidalgo Amedeo Kinsman MD ELECTRONICALLY SIGNED 05/19/2013 11:22

## 2014-09-15 DIAGNOSIS — M170 Bilateral primary osteoarthritis of knee: Secondary | ICD-10-CM | POA: Diagnosis not present

## 2014-09-22 DIAGNOSIS — M170 Bilateral primary osteoarthritis of knee: Secondary | ICD-10-CM | POA: Diagnosis not present

## 2014-12-22 ENCOUNTER — Ambulatory Visit (INDEPENDENT_AMBULATORY_CARE_PROVIDER_SITE_OTHER): Payer: Medicare Other | Admitting: Family Medicine

## 2014-12-22 ENCOUNTER — Encounter: Payer: Self-pay | Admitting: Family Medicine

## 2014-12-22 VITALS — BP 118/78 | HR 72 | Temp 98.8°F | Resp 16 | Ht 63.0 in | Wt 217.0 lb

## 2014-12-22 DIAGNOSIS — M1990 Unspecified osteoarthritis, unspecified site: Secondary | ICD-10-CM | POA: Insufficient documentation

## 2014-12-22 DIAGNOSIS — J069 Acute upper respiratory infection, unspecified: Secondary | ICD-10-CM | POA: Diagnosis not present

## 2014-12-22 DIAGNOSIS — I10 Essential (primary) hypertension: Secondary | ICD-10-CM | POA: Insufficient documentation

## 2014-12-22 DIAGNOSIS — M2550 Pain in unspecified joint: Secondary | ICD-10-CM | POA: Insufficient documentation

## 2014-12-22 DIAGNOSIS — M170 Bilateral primary osteoarthritis of knee: Secondary | ICD-10-CM

## 2014-12-22 DIAGNOSIS — G250 Essential tremor: Secondary | ICD-10-CM | POA: Insufficient documentation

## 2014-12-22 DIAGNOSIS — M7552 Bursitis of left shoulder: Secondary | ICD-10-CM

## 2014-12-22 DIAGNOSIS — K5730 Diverticulosis of large intestine without perforation or abscess without bleeding: Secondary | ICD-10-CM | POA: Insufficient documentation

## 2014-12-22 DIAGNOSIS — M129 Arthropathy, unspecified: Secondary | ICD-10-CM

## 2014-12-22 DIAGNOSIS — Z780 Asymptomatic menopausal state: Secondary | ICD-10-CM | POA: Insufficient documentation

## 2014-12-22 DIAGNOSIS — L570 Actinic keratosis: Secondary | ICD-10-CM | POA: Insufficient documentation

## 2014-12-22 MED ORDER — TRAMADOL HCL 50 MG PO TABS: 50.0000 mg | ORAL_TABLET | Freq: Three times a day (TID) | ORAL | Status: DC | PRN

## 2014-12-22 NOTE — Progress Notes (Signed)
Patient: Olivia [name] Female    DOB: [birth year]   69 y.o.   MRN: [id number] Visit Date: 12/22/2014  Today's Provider: Lelon Huh, MD   Chief Complaint  Patient presents with  . Knee Pain  . Shoulder Pain  . Sinusitis   Subjective:    Sinusitis This is a new problem. The current episode started more than 1 month ago. The problem has been gradually improving since onset. There has been no fever. She is experiencing no pain. Associated symptoms include congestion, coughing and sneezing. Pertinent negatives include no ear pain, headaches, sinus pressure, sore throat or swollen glands. Treatments tried: vitamin c. The treatment provided mild relief.     Patient being seen for bilateral knee pain. Has had knee pain for several years. Patient sees [facility] orthoped[facility], Dr. Gretta Cool for knee pain and has had series of Synvisc injections She did try tramadol which she states helped. Has tried Motrin and Tylenol with minimal relief. .  Patient also has left shoulder pain and neck pain for the last several months. She has trouble abducting her left arm > 90 degrees.   Patient has red spot on her forehead, right side. Red spot has been on her forehead for 3 weeks. Spot has no burning or itching, patient says that spot only bothers her enough that she knows that its there.  No Known Allergies Previous Medications   ASCORBIC ACID (VITAMIN C) 100 MG TABLET    Take 100 mg by mouth daily.   MELOXICAM (MOBIC) 15 MG TABLET    Take 15 mg by mouth daily.   MULTIPLE VITAMIN (MULTIVITAMIN) CAPSULE    Take 1 capsule by mouth daily.    Review of Systems  Constitutional: Positive for fatigue. Negative for fever.  HENT: Positive for congestion and sneezing. Negative for ear pain, sinus pressure and sore throat.   Respiratory: Positive for cough.   Cardiovascular: Negative for chest pain, palpitations and leg swelling.  Musculoskeletal: Positive for myalgias, joint swelling, gait problem and neck  stiffness.  Neurological: Positive for numbness. Negative for dizziness, light-headedness and headaches.       Patient does have some numbness in her hands at night    Social History  Substance Use Topics  . Smoking status: Never Smoker   . Smokeless tobacco: Never Used  . Alcohol Use: 0.0 oz/week    0 Standard drinks or equivalent per week     Comment: occasional use   Objective:   BP 118/78 mmHg  Pulse 72  Temp(Src) 98.8 F (37.1 C) (Oral)  Resp 16  Ht 5\' 3"  (1.6 m)  Wt 217 lb (98.431 kg)  BMI 38.45 kg/m2  Physical Exam   General Appearance:    Alert, cooperative, no distress  HENT:   bilateral TM normal without fluid or infection, neck without nodes, throat normal without erythema or exudate, sinuses nontender and nasal mucosa congested  Eyes:    PERRL, conjunctiva/corneas clear, EOM's intact       Lungs:     Clear to auscultation bilaterally, respirations unlabored  Heart:    Regular rate and rhythm  Neurologic:   Awake, alert, oriented x 3. No apparent focal neurological           defect.   MS:   Tender along left trapezius and superior aspect of left shoulder. Positive impingement sign.         Assessment & Plan:     1. Arthritis of both knees Not  improved after Synvisc series. Will try prn tramadol which she does not feel like she needs to take every day.  - traMADol (ULTRAM) 50 MG tablet; Take 1-2 tablets (50-100 mg total) by mouth every 8 (eight) hours as needed.  Dispense: 60 tablet; Refill: 1  2. Bursitis, shoulder, left Instructed on passive ROM exercises. May use heat pad 1-2 times a day. If not resolving in a few weeks she should follow up with Dr. Gretta Cool.   3. Upper respiratory infection Nearly resolved. Call if symptoms change or if not rapidly improving.        Lelon Huh, MD  [facility]

## 2015-02-14 ENCOUNTER — Ambulatory Visit (INDEPENDENT_AMBULATORY_CARE_PROVIDER_SITE_OTHER): Payer: Medicare Other | Admitting: Family Medicine

## 2015-02-14 ENCOUNTER — Encounter: Payer: Self-pay | Admitting: Family Medicine

## 2015-02-14 VITALS — BP 120/50 | HR 70 | Temp 98.8°F | Resp 16 | Ht 63.0 in | Wt 216.0 lb

## 2015-02-14 DIAGNOSIS — M1990 Unspecified osteoarthritis, unspecified site: Secondary | ICD-10-CM | POA: Diagnosis not present

## 2015-02-14 DIAGNOSIS — G4730 Sleep apnea, unspecified: Secondary | ICD-10-CM | POA: Insufficient documentation

## 2015-02-14 DIAGNOSIS — Z6838 Body mass index (BMI) 38.0-38.9, adult: Secondary | ICD-10-CM

## 2015-02-14 DIAGNOSIS — R5383 Other fatigue: Secondary | ICD-10-CM | POA: Diagnosis not present

## 2015-02-14 DIAGNOSIS — G4700 Insomnia, unspecified: Secondary | ICD-10-CM

## 2015-02-14 DIAGNOSIS — Z01419 Encounter for gynecological examination (general) (routine) without abnormal findings: Secondary | ICD-10-CM

## 2015-02-14 NOTE — Patient Instructions (Addendum)
Please call to schedule sleep study at your earliest convenience  I recommend that you get a flu vaccine this year. Please call our office at [phone] at your earliest convenience to schedule a flu shot.

## 2015-02-14 NOTE — Progress Notes (Signed)
Patient: Olivia [name], Female    DOB: 19-Feb-1946, 69 y.o.   MRN: [id number] Visit Date: 02/14/2015  Today's Provider: Lelon Huh, MD   Chief Complaint  Patient presents with  . Medicare Wellness  . Hypertension   Subjective:    Annual wellness visit Olivia [name] is a 68 y.o. female. She feels fairly well. She reports exercising no. She reports she is sleeping poorly.  -----------------------------------------------------------    Follow-up for arthritis of both knees from 12/22/2014; s/p Synvisc injections at [facility]. She was started on Tramadol 50 mg prn in August due to persistent left shoulder pain. She states she takes it at night and it does help pain enough that she can get to sleep. However, she states she stays restless all night and doesn't think she gets a good nights sleeps. She does have history of sleep apnea diagnosed years ago, but she did not tolerate CPAP.     Hypertension, follow-up:  BP Readings from Last 3 Encounters:  02/14/15 120/50  12/22/14 118/78  07/14/13 138/78    She was last seen for hypertension 1 years ago.  BP at that visit was 128/60. Management since that visit includes none. She reports good compliance with treatment. She is not having side effects. none  She is not exercising. She is adherent to low salt diet.   Outside blood pressures are n/a. She is experiencing chest pain.  Patient denies chest pain.   Cardiovascular risk factors include none.  Use of agents associated with hypertension: none.     Weight trend: stable Wt Readings from Last 3 Encounters:  02/14/15 216 lb (97.977 kg)  12/22/14 217 lb (98.431 kg)  07/14/13 201 lb (91.173 kg)    Current diet: in general, an "unhealthy" diet  ----------------------------------------------------------------------    Review of Systems  Constitutional: Positive for fatigue.  HENT: Positive for sneezing.   Eyes: Negative.   Respiratory: Negative.     Cardiovascular: Negative.   Gastrointestinal: Negative.   Endocrine: Negative.   Genitourinary: Negative.   Musculoskeletal: Positive for arthralgias, neck pain and neck stiffness.  Skin: Negative.   Allergic/Immunologic: Negative.   Neurological: Positive for tremors.  Hematological: Negative.   Psychiatric/Behavioral: Positive for sleep disturbance.    Social History   Social History  . Marital Status: Single    Spouse Name: N/A  . Number of Children: 3  . Years of Education: N/A   Occupational History  . 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 Smoking status: Never Smoker   . Smokeless tobacco: Never Used  . Alcohol Use: 0.0 oz/week    0 Standard drinks or equivalent per week     Comment: occasional use  . Drug Use: No  . Sexual Activity: Not on file   Other Topics Concern  . Not on file   Social History Narrative    Patient Active Problem List   Diagnosis Date Noted  . Arthritis of both knees 12/22/2014  . Essential tremor 12/22/2014  . Diverticulosis of colon 12/22/2014  . Essential (primary) hypertension 12/22/2014  . Actinic keratoses 12/22/2014  . Arthralgia 12/22/2014  . Postmenopausal estrogen deficiency 12/22/2014  . DJD (degenerative joint disease) 12/22/2014  . Diverticulitis 02/20/2013    Past Surgical History  Procedure Laterality Date  . Cholecystectomy  2003 ?  Marland Kitchen Foot surgery Right 2000  . Knee surgery Right 2004  arthroscopy of knee; torn meniscus  . Colonoscopy  01-09-13    Dr Donnella Sham  . Upper gi endoscopy  01-09-13    Dr Donnella Sham. Antral biopsy with minimal gastritis  . Appendectomy    . Colon surgery  05/15/13    Sigmoid colectomy for focal diverticulosis/diverticulitis with contained abscess.  . Abdominal hysterectomy  1976    inverted Uterus. No cancer    Her family history includes Alzheimer's disease in her father; Dementia in her father; Diabetes  in her daughter; Hypertension in her daughter.    Previous Medications   ASCORBIC ACID (VITAMIN C) 100 MG TABLET    Take 100 mg by mouth daily.   MELOXICAM (MOBIC) 15 MG TABLET    Take 15 mg by mouth daily.   MULTIPLE VITAMIN (MULTIVITAMIN) CAPSULE    Take 1 capsule by mouth daily.   TRAMADOL (ULTRAM) 50 MG TABLET    Take 1-2 tablets (50-100 mg total) by mouth every 8 (eight) hours as needed.    Patient Care Team: Birdie Sons, MD as PCP - General (Family Medicine) Robert Bellow, MD (General Surgery) Lollie Sails, MD as Physician Assistant (Internal Medicine) Angelia Mould, NP as Referring Physician     Objective:   Vitals: BP 120/50 mmHg  Pulse 70  Temp(Src) 98.8 F (37.1 C) (Oral)  Resp 16  Ht 5\' 3"  (1.6 m)  Wt 216 lb (97.977 kg)  BMI 38.27 kg/m2  SpO2 95%  Physical Exam   General Appearance:    Alert, cooperative, no distress, appears stated age, obese  Head:    Normocephalic, without obvious abnormality, atraumatic  Eyes:    PERRL, conjunctiva/corneas clear, EOM's intact, fundi    benign, both eyes  Ears:    Normal TM's and external ear canals, both ears  Nose:   Nares normal, septum midline, mucosa normal, no drainage    or sinus tenderness  Throat:   Lips, mucosa, and tongue normal; teeth and gums normal  Neck:   Supple, symmetrical, trachea midline, no adenopathy;    thyroid:  no enlargement/tenderness/nodules; no carotid   bruit or JVD  Back:     Symmetric, no curvature, ROM normal, no CVA tenderness  Lungs:     Clear to auscultation bilaterally, respirations unlabored  Chest Wall:    No tenderness or deformity   Heart:    Regular rate and rhythm, S1 and S2 normal, no murmur, rub   or gallop  Breast Exam:    normal appearance, no masses or tenderness, deferred  Abdomen:     Soft, non-tender, bowel sounds active all four quadrants,    no masses, no organomegaly  Pelvic:    deferred  Extremities:   Extremities normal, atraumatic, no cyanosis or  edema  Pulses:   2+ and symmetric all extremities  Skin:   Skin color, texture, turgor normal, no rashes or lesions  Lymph nodes:   Cervical, supraclavicular, and axillary nodes normal  Neurologic:   CNII-XII intact, normal strength, sensation and reflexes    throughout     Activities of Daily Living In your present state of health, do you have any difficulty performing the following activities: 02/14/2015  Hearing? N  Vision? N  Difficulty concentrating or making decisions? N  Walking or climbing stairs? N  Dressing or bathing? N  Doing errands, shopping? N    Fall Risk Assessment Fall Risk  02/14/2015  Falls in the past year? No     Depression Screen PHQ 2/9 Scores  02/14/2015  PHQ - 2 Score 2  PHQ- 9 Score 7    Cognitive Testing - 6-CIT  Correct? Score   What year is it? yes 0 0 or 4  What month is it? yes 0 0 or 3  Memorize:    Pia Mau,  41,  [place],      What time is it? (within 1 hour) yes 0 0 or 3  Count backwards from 20 yes 0 0, 2, or 4  Name the months of the year yes 0 0, 2, or 4  Repeat name & address above yes 0 0, 2, 4, 6, 8, or 10       TOTAL SCORE  0/28   Interpretation:  Normal  Normal (0-7) Abnormal (8-28)    Audit-C Alcohol Use Screening  Question Answer Points  How often do you have alcoholic drink? 2-3 times monthly 2  On days you do drink alcohol, how many drinks do you typically consume? 1-2 0  How oftey will you drink 6 or more in a total? never 0  Total Score:  2   A score of 3 or more in women, and 4 or more in men indicates increased risk for alcohol abuse, EXCEPT if all of the points are from question 1.    Assessment & Plan:     Annual Wellness Visit  Reviewed patient's Family Medical History Reviewed and updated list of patient's medical providers Assessment of cognitive impairment was done Assessed patient's functional ability Established a written schedule for health screening [place] Completed and Reviewed  Exercise Activities and Dietary recommendations Goals    None      Immunization History  Administered Date(s) Administered  . Pneumococcal Conjugate-13 02/10/2014  . Pneumococcal Polysaccharide-23 04/11/2011  . Tdap 03/29/2010    Health Maintenance  Topic Date Due  . Hepatitis C Screening  19-May-1945  . ZOSTAVAX  03/21/2005  . INFLUENZA VACCINE  11/29/2014  . COLONOSCOPY  01/10/2016  . MAMMOGRAM  04/07/2016  . TETANUS/TDAP  03/29/2020  . DEXA SCAN  Completed  . PNA vac Low Risk Adult  Completed      Discussed health benefits of physical activity, and encouraged her to engage in regular exercise appropriate for her age and condition.    ------------------------------------------------------------------------------------------------------------  1. Well woman exam with routine gynecological exam   2. Insomnia Likely secondary to sleep apnea as below.   3. Osteoarthritis, unspecified osteoarthritis type, unspecified site Shoulder pain is better when she takes tramadol. Advised to follow up with orthopedic if she needs more aggressive treatment.   4. Other fatigue  - TSH - Comprehensive metabolic panel  5. Sleep apnea Advised her that we should do a  New sleep study given symptoms above. However she states she is not interested in treatment for it. Advised to call to schedule sleep study if she changes her mind.

## 2015-02-15 ENCOUNTER — Telehealth: Payer: Self-pay

## 2015-02-15 LAB — COMPREHENSIVE METABOLIC PANEL
ALBUMIN: 4.2 g/dL (ref 3.6–4.8)
ALT: 18 IU/L (ref 0–32)
AST: 16 IU/L (ref 0–40)
Albumin/Globulin Ratio: 1.5 (ref 1.1–2.5)
Alkaline Phosphatase: 84 IU/L (ref 39–117)
BILIRUBIN TOTAL: 0.3 mg/dL (ref 0.0–1.2)
BUN / CREAT RATIO: 17 (ref 11–26)
BUN: 13 mg/dL (ref 8–27)
CO2: 25 mmol/L (ref 18–29)
CREATININE: 0.76 mg/dL (ref 0.57–1.00)
Calcium: 9.5 mg/dL (ref 8.7–10.3)
Chloride: 102 mmol/L (ref 97–106)
GFR calc non Af Amer: 80 mL/min/{1.73_m2} (ref >59)
GFR, EST AFRICAN AMERICAN: 93 mL/min/{1.73_m2} (ref >59)
GLOBULIN, TOTAL: 2.8 g/dL (ref 1.5–4.5)
GLUCOSE: 101 mg/dL — AB (ref 65–99)
Potassium: 4.5 mmol/L (ref 3.5–5.2)
SODIUM: 142 mmol/L (ref 136–144)
TOTAL PROTEIN: 7 g/dL (ref 6.0–8.5)

## 2015-02-15 LAB — TSH: TSH: 1.13 u[IU]/mL (ref 0.450–4.500)

## 2015-02-15 NOTE — Telephone Encounter (Signed)
Advised pt as directed. Pt verbalized fully understanding.  Thanks,   

## 2015-02-15 NOTE — Telephone Encounter (Signed)
-----   Message from Birdie Sons, MD sent at 02/15/2015  7:50 AM EDT ----- Blood sugar, kidney functions, electrolytes and thyroid functions are all normal. Check labs yearly.

## 2016-02-15 DIAGNOSIS — H52223 Regular astigmatism, bilateral: Secondary | ICD-10-CM | POA: Diagnosis not present

## 2016-02-15 DIAGNOSIS — H5203 Hypermetropia, bilateral: Secondary | ICD-10-CM | POA: Diagnosis not present

## 2016-02-15 DIAGNOSIS — H2513 Age-related nuclear cataract, bilateral: Secondary | ICD-10-CM | POA: Diagnosis not present

## 2016-02-15 DIAGNOSIS — H524 Presbyopia: Secondary | ICD-10-CM | POA: Diagnosis not present

## 2016-03-09 ENCOUNTER — Telehealth: Payer: Self-pay | Admitting: Family Medicine

## 2016-03-09 NOTE — Telephone Encounter (Signed)
Called Pt to schedule AWV with [facility] - knb °

## 2016-04-13 ENCOUNTER — Encounter: Payer: Self-pay | Admitting: Physician Assistant

## 2016-04-13 ENCOUNTER — Ambulatory Visit (INDEPENDENT_AMBULATORY_CARE_PROVIDER_SITE_OTHER): Payer: Medicare Other | Admitting: Physician Assistant

## 2016-04-13 ENCOUNTER — Ambulatory Visit (INDEPENDENT_AMBULATORY_CARE_PROVIDER_SITE_OTHER): Payer: Medicare Other

## 2016-04-13 VITALS — BP 138/78 | HR 76 | Temp 97.8°F | Ht 62.0 in | Wt 217.4 lb

## 2016-04-13 VITALS — BP 138/60 | HR 88 | Temp 98.4°F | Resp 16 | Wt 220.0 lb

## 2016-04-13 DIAGNOSIS — M7502 Adhesive capsulitis of left shoulder: Secondary | ICD-10-CM

## 2016-04-13 DIAGNOSIS — M170 Bilateral primary osteoarthritis of knee: Secondary | ICD-10-CM

## 2016-04-13 DIAGNOSIS — Z0000 Encounter for general adult medical examination without abnormal findings: Secondary | ICD-10-CM | POA: Diagnosis not present

## 2016-04-13 DIAGNOSIS — Z791 Long term (current) use of non-steroidal anti-inflammatories (NSAID): Secondary | ICD-10-CM

## 2016-04-13 DIAGNOSIS — B372 Candidiasis of skin and nail: Secondary | ICD-10-CM

## 2016-04-13 MED ORDER — OMEPRAZOLE 20 MG PO CPDR: 20.0000 mg | DELAYED_RELEASE_CAPSULE | Freq: Every day | ORAL | 3 refills | Status: DC

## 2016-04-13 MED ORDER — MELOXICAM 15 MG PO TABS: 15.0000 mg | ORAL_TABLET | Freq: Every day | ORAL | 0 refills | Status: DC

## 2016-04-13 MED ORDER — NYSTATIN 100000 UNIT/GM EX POWD: Freq: Three times a day (TID) | CUTANEOUS | 0 refills | Status: DC

## 2016-04-13 NOTE — Patient Instructions (Signed)
Arthritis Introduction Arthritis means joint pain. It can also mean joint disease. A joint is a place where bones come together. People who have arthritis may have:  Red joints.  Swollen joints.  Stiff joints.  Warm joints.  A fever.  A feeling of being sick. Follow these instructions at home: Pay attention to any changes in your symptoms. Take these actions to help with your pain and swelling. Medicines  Take over-the-counter and prescription medicines only as told by your doctor.  Do not take aspirin for pain if your doctor says that you may have gout. Activity  Rest your joint if your doctor tells you to.  Avoid activities that make the pain worse.  Exercise your joint regularly as told by your doctor. Try doing exercises like:  Swimming.  Water aerobics.  Biking.  Walking. Joint Care   If your joint is swollen, keep it raised (elevated) if told by your doctor.  If your joint feels stiff in the morning, try taking a warm shower.  If you have diabetes, do not apply heat without asking your doctor.  If told, apply heat to the joint:  Put a towel between the joint and the hot pack or heating pad.  Leave the heat on the area for 20-30 minutes.  If told, apply ice to the joint:  Put ice in a plastic bag.  Place a towel between your skin and the bag.  Leave the ice on for 20 minutes, 2-3 times per day.  Keep all follow-up visits as told by your doctor. Contact a doctor if:  The pain gets worse.  You have a fever. Get help right away if:  You have very bad pain in your joint.  You have swelling in your joint.  Your joint is red.  Many joints become painful and swollen.  You have very bad back pain.  Your leg is very weak.  You cannot control your pee (urine) or poop (stool). This information is not intended to replace advice given to you by your health care provider. Make sure you discuss any questions you have with your health care  provider. Document Released: 07/11/2009 Document Revised: 09/22/2015 Document Reviewed: 07/12/2014  2017 Elsevier  

## 2016-04-13 NOTE — Progress Notes (Addendum)
Patient: Olivia [name] Female    DOB: [birth year]   70 y.o.   MRN: [id number] Visit Date: 04/13/2016  Today's Provider: Trinna Post, PA-C   Chief Complaint  Patient presents with  . Knee Pain    Bilateral; Left worse than right  . Shoulder Pain    Left shoulder   . URI   Subjective:    Knee Pain   There was no injury mechanism. The pain is present in the left knee and right knee. The quality of the pain is described as aching, shooting and stabbing. The pain has been worsening since onset. Pertinent negatives include no inability to bear weight, loss of motion, loss of sensation, muscle weakness, numbness or tingling.  Shoulder Pain   The pain is present in the left shoulder. The current episode started more than 1 year ago. The problem has been unchanged. Associated symptoms include a limited range of motion. Pertinent negatives include no inability to bear weight, joint locking, joint swelling, numbness or tingling.  URI   This is a new problem. The current episode started 1 to 4 weeks ago. There has been no fever. Associated symptoms include a rash, sinus pain and sneezing. Pertinent negatives include no joint swelling.  Rash  This is a new problem. The problem has been waxing and waning (Pt reports having it on and off for a long time. ) since onset. The affected locations include the groin. Associated symptoms include fatigue. Pertinent negatives include no shortness of breath. Past treatments include anti-itch cream. The treatment provided no relief.   Patient is a 70 y/o female with extensive orthopedic history who is presenting today with bilateral knee pain and left shoulder pain and a rash in her groin.   Her orthopedic history starts 15 years ago when she reports she tore her right meniscus and had arthroscopic repair surgery. She reports shortly after this, she began going for cortisone injections in bilateral knees with Dr. Sabra Heck. She had a right knee CT  in 2006 that showed severe arthritis of her right knee. Most recently, she has been seeing orthopedist Venia Carbon, NP at [facility] and receiving Synvysc injections, which have been moderately helpful. Bilateral knee xray from [facility] in 06/2014 show severe degenerative changes in bilateral knees. Most recently, she was given tramadol but she didn't like the way it made her felt. She takes ibuprofen consistently, but worries how it is on her stomach. She does not want anything that will make her feel "weird." She is having a hard time walking now and nearly constant pain. She is also reporting pain in her left shoulder x 1 year and reports inability to lift it all the way. She has trouble putting her coat on with this shoulder. No injury that she could remember.   Presenting with above listed URI symptoms.   Presenting with red rash in inguinal folds and along underside of panus. This rash is itchy, not responding to OTC miconazole cream, rubbing alcohol, benadryl lotion.   No Known Allergies   Current Outpatient Prescriptions:  .  Ascorbic Acid (VITAMIN C) 100 MG tablet, Take 100 mg by mouth daily., Disp: , Rfl:  .  ibuprofen (ADVIL,MOTRIN) 200 MG tablet, Take 200 mg by mouth every 6 (six) hours as needed., Disp: , Rfl:  .  meloxicam (MOBIC) 15 MG tablet, Take 1 tablet (15 mg total) by mouth daily., Disp: 30 tablet, Rfl: 0 .  Multiple Vitamin (MULTIVITAMIN) capsule, Take 1  capsule by mouth daily. , Disp: , Rfl:  .  nystatin (MYCOSTATIN/NYSTOP) powder, Apply topically 3 (three) times daily., Disp: 15 g, Rfl: 0 .  omeprazole (PRILOSEC) 20 MG capsule, Take 1 capsule (20 mg total) by mouth daily., Disp: 30 capsule, Rfl: 3 .  Pseudoeph-Doxylamine-DM-APAP (NYQUIL MULTI-SYMPTOM PO), Take by mouth., Disp: , Rfl:  .  traMADol (ULTRAM) 50 MG tablet, Take 1-2 tablets (50-100 mg total) by mouth every 8 (eight) hours as needed. (Patient not taking: Reported on 04/13/2016), Disp: 60 tablet, Rfl: 1  Review of Systems    Constitutional: Positive for fatigue. Negative for activity change, appetite change, chills, diaphoresis and unexpected weight change.  HENT: Positive for postnasal drip, sinus pain, sinus pressure and sneezing. Negative for nosebleeds and tinnitus.   Eyes: Negative for visual disturbance.  Respiratory: Negative for apnea, choking, chest tightness and shortness of breath.   Gastrointestinal: Negative.   Musculoskeletal: Positive for arthralgias (Left shoulder pain and bilateral knee pain.). Negative for back pain, gait problem, joint swelling, myalgias and neck stiffness.  Skin: Positive for rash.  Neurological: Positive for tremors. Negative for dizziness, tingling, light-headedness and numbness.    Social History  Substance Use Topics  . Smoking status: Never Smoker  . Smokeless tobacco: Never Used  . Alcohol use 0.0 oz/week     Comment: occasional use   Objective:   BP 138/60 (BP Location: Left Arm, Patient Position: Sitting, Cuff Size: Large)   Pulse 88   Temp 98.4 F (36.9 C) (Oral)   Resp 16   Wt 220 lb (99.8 kg)   BMI 40.24 kg/m   Physical Exam  Constitutional: She appears well-developed and well-nourished.  Neck: Neck supple.  Cardiovascular: Normal rate and regular rhythm.   Pulmonary/Chest: Effort normal and breath sounds normal.  Abdominal: Soft. Bowel sounds are normal.  Musculoskeletal: She exhibits no edema, tenderness or deformity.  Marked crepitus in knees bilaterally. No joint line or landmark pain bilateral knees. Normal ROM. Difficulty initiating walking.   Right shoulder is normal. Patient has about 75 degrees of abduction in her left shoulder before she is physically unable to move it. Difficulty moving left shoulder past 90 degrees abduction on passive ROM. Pain on left sided internal rotation but negative belly press and lift off maneuver. No impingement signs. Strength normal except for weakened abduction. Sensation grossly in tact BUE.  Lymphadenopathy:     She has no cervical adenopathy.  Skin:  Red maculopapular lesions in intertriginous region.        Assessment & Plan:      Problem List Items Addressed This Visit      Musculoskeletal and Integument   DJD (degenerative joint disease) - Primary   Relevant Medications   meloxicam (MOBIC) 15 MG tablet    Other Visit Diagnoses    Adhesive capsulitis of left shoulder       Relevant Medications   meloxicam (MOBIC) 15 MG tablet   Other Relevant Orders   Ambulatory referral to Physical Therapy   NSAID long-term use       Relevant Medications   omeprazole (PRILOSEC) 20 MG capsule   Yeast infection of the skin       Relevant Medications   nystatin (MYCOSTATIN/NYSTOP) powder     Patient is 70 year old female presenting with above issues.  Knee Osteoarthritis  Patient has extensive orthopedic history, as listed above. Have counseled patient that with the extent of her arthritis, there is little I can personally do for her outside  of pain management until she can see an orthopedist, with the likely goal being eventual knee replacement surgery. She asked me about [facility]'s stem cell injections and I told her truthfully I know very little about it. She may pursue that on her own. Does not want to start narcotics. Have counseled on risk/benefit of Mobic x 1 month until she can get into ortho. She agrees to risks, no hx of bleeding ulcer. Will have her take 20 mg Prilosec while taking the Mobic 15 mg, daily with food. Patient will call back into orthopedist to get appt. Counseled on benefits of weight loss.  Adhesive Capsulitis of Left Shoulder  Suspect patient had injury at some remote point and has ceased full Rom of her arm. Recommended physical therapy for this and evaluation by ortho when she goes to see them.  URI  Likely viral, supportive treatment.  Yeast infection  Treat with Nystatin powder.  I have spent 25 minutes with this patient, >50% of which was spent  on counseling and coordination of care.   The entirety of the information documented in the History of Present Illness, Review of Systems and Physical Exam were personally obtained by me. Portions of this information were initially documented by Ashley Royalty, CMA and reviewed by me for thoroughness and accuracy.   Patient Instructions  Arthritis Introduction Arthritis means joint pain. It can also mean joint disease. A joint is a place where bones come together. People who have arthritis may have:  Red joints.  Swollen joints.  Stiff joints.  Warm joints.  A fever.  A feeling of being sick. Follow these instructions at home: Pay attention to any changes in your symptoms. Take these actions to help with your pain and swelling. Medicines  Take over-the-counter and prescription medicines only as told by your doctor.  Do not take aspirin for pain if your doctor says that you may have gout. Activity  Rest your joint if your doctor tells you to.  Avoid activities that make the pain worse.  Exercise your joint regularly as told by your doctor. Try doing exercises like:  Swimming.  Water aerobics.  Biking.  Walking. Joint Care   If your joint is swollen, keep it raised (elevated) if told by your doctor.  If your joint feels stiff in the morning, try taking a warm shower.  If you have diabetes, do not apply heat without asking your doctor.  If told, apply heat to the joint:  Put a towel between the joint and the hot pack or heating pad.  Leave the heat on the area for 20-30 minutes.  If told, apply ice to the joint:  Put ice in a plastic bag.  Place a towel between your skin and the bag.  Leave the ice on for 20 minutes, 2-3 times per day.  Keep all follow-up visits as told by your doctor. Contact a doctor if:  The pain gets worse.  You have a fever. Get help right away if:  You have very bad pain in your joint.  You have swelling in your joint.  Your  joint is red.  Many joints become painful and swollen.  You have very bad back pain.  Your leg is very weak.  You cannot control your pee (urine) or poop (stool). This information is not intended to replace advice given to you by your health care provider. Make sure you discuss any questions you have with your health care provider. Document Released: 07/11/2009 Document  Revised: 09/22/2015 Document Reviewed: 07/12/2014  2017 Elsevier    Return if symptoms worsen or fail to improve.       Trinna Post, PA-C  [facility]

## 2016-04-13 NOTE — Progress Notes (Signed)
Subjective:   Olivia [name] is a 70 y.o. female who presents for Medicare Annual (Subsequent) preventive examination.  Review of Systems:  N/A  Cardiac Risk Factors include: advanced age (>47men, >16 women);hypertension;obesity (BMI >30kg/m2)     Objective:     Vitals: BP 138/78 (BP Location: Right Arm)   Pulse 76   Temp 97.8 F (36.6 C) (Oral)   Ht 5\' 2"  (1.575 m)   Wt 217 lb 6 oz (98.6 kg)   BMI 39.76 kg/m   Body mass index is 39.76 kg/m.   Tobacco History  Smoking Status  . Never Smoker  Smokeless Tobacco  . Never Used     Counseling given: Not Answered   Past Medical History:  Diagnosis Date  . Diverticulitis 02/20/2013  . History of chicken pox   . History of measles   . History of mumps    Past Surgical History:  Procedure Laterality Date  . ABDOMINAL HYSTERECTOMY  1976   inverted Uterus. No cancer  . APPENDECTOMY    . CHOLECYSTECTOMY  2003 ?  . COLON SURGERY  05/15/13   Sigmoid colectomy for focal diverticulosis/diverticulitis with contained abscess.  . COLONOSCOPY  01-09-13   Dr Donnella Sham  . FOOT SURGERY Right 2000  . KNEE SURGERY Right 2004   arthroscopy of knee; torn meniscus  . UPPER GI ENDOSCOPY  01-09-13   Dr Donnella Sham. Antral biopsy with minimal gastritis   Family History  Problem Relation Age of Onset  . Alzheimer's disease Father   . Dementia Father   . Diabetes Daughter   . Hypertension Daughter    History  Sexual Activity  . Sexual activity: Not on file    Outpatient Encounter Prescriptions as of 04/13/2016  Medication Sig  . Ascorbic Acid (VITAMIN C) 100 MG tablet Take 100 mg by mouth daily.  Marland Kitchen ibuprofen (ADVIL,MOTRIN) 200 MG tablet Take 200 mg by mouth every 6 (six) hours as needed.  . Multiple Vitamin (MULTIVITAMIN) capsule Take 1 capsule by mouth daily.   . Pseudoeph-Doxylamine-DM-APAP (NYQUIL MULTI-SYMPTOM PO) Take by mouth.  . meloxicam (MOBIC) 15 MG tablet Take 15 mg by mouth daily.  . traMADol (ULTRAM) 50 MG  tablet Take 1-2 tablets (50-100 mg total) by mouth every 8 (eight) hours as needed. (Patient not taking: Reported on 04/13/2016)   No facility-administered encounter medications on file as of 04/13/2016.     Activities of Daily Living In your present state of health, do you have any difficulty performing the following activities: 04/13/2016  Hearing? N  Vision? N  Difficulty concentrating or making decisions? N  Walking or climbing stairs? Y  Dressing or bathing? N  Doing errands, shopping? N  Preparing Food and eating ? N  Using the Toilet? N  In the past six months, have you accidently leaked urine? Y  Do you have problems with loss of bowel control? N  Managing your Medications? N  Managing your Finances? N  Housekeeping or managing your Housekeeping? N  Some recent data might be hidden    Patient Care Team: Birdie Sons, MD as PCP - General (Family Medicine) Thelma Comp, OD as Consulting Physician (Optometry)    Assessment:     Exercise Activities and Dietary recommendations Current Exercise Habits: The patient does not participate in regular exercise at present (has gym membership but is not using it), Exercise limited by: None identified  Goals    . Increase water intake          Starting  04/13/16, I will increase my water intake to 3 glasses a day.      Fall Risk Fall Risk  04/13/2016 04/13/2016 02/14/2015  Falls in the past year? No No No   Depression Screen PHQ 2/9 Scores 04/13/2016 04/13/2016 02/14/2015  PHQ - 2 Score 1 1 2   PHQ- 9 Score - - 7     Cognitive Function     6CIT Screen 04/13/2016  What Year? 0 points  What month? 0 points  What time? 0 points  Count back from 20 0 points  Months in reverse 0 points  Repeat phrase 0 points  Total Score 0    Immunization History  Administered Date(s) Administered  . Pneumococcal Conjugate-13 02/10/2014  . Pneumococcal Polysaccharide-23 04/11/2011  . Tdap 03/29/2010   Screening  Tests Health Maintenance  Topic Date Due  . INFLUENZA VACCINE  11/28/2016 (Originally 11/29/2015)  . COLONOSCOPY  01/10/2023 (Originally 01/10/2016)  . ZOSTAVAX  04/13/2026 (Originally 03/21/2005)  . Hepatitis C Screening  04/13/2026 (Originally [birth year])  . MAMMOGRAM  04/07/2017  . TETANUS/TDAP  03/29/2020  . DEXA SCAN  Completed  . PNA vac Low Risk Adult  Completed      Plan:  I have personally reviewed and addressed the Medicare Annual Wellness questionnaire and have noted the following in the patient's chart:  A. Medical and social history B. Use of alcohol, tobacco or illicit drugs  C. Current medications and supplements D. Functional ability and status E.  Nutritional status F.  Physical activity G. Advance directives H. List of other physicians I.  Hospitalizations, surgeries, and ER visits in previous 12 months J.  [place] such as hearing and vision if needed, cognitive and depression L. Referrals and appointments - none  In addition, I have reviewed and discussed with patient certain preventive protocols, quality metrics, and best practice recommendations. A written personalized care plan for preventive services as well as general preventive health recommendations were provided to patient.  See attached scanned questionnaire for additional information.   Signed,  Fabio Neighbors, LPN Nurse Health Advisor   MD Recommendations: Follow up on mamogram results. Pt has appointment scheduled for the first of 2018.

## 2016-04-13 NOTE — Patient Instructions (Signed)
Olivia [name] , Thank you for taking time to come for your Medicare Wellness Visit. I appreciate your ongoing commitment to your health goals. Please review the following plan we discussed and let me know if I can assist you in the future.   These are the goals we discussed: Goals    . Increase water intake          Starting 04/13/16, I will increase my water intake to 3 glasses a day.       This is a list of the screening recommended for you and due dates:  Health Maintenance  Topic Date Due  .  Hepatitis C: One time screening is recommended by Center for Disease Control  (CDC) for  adults born from [birth year] through 1966.   1945/07/09  . Shingles Vaccine  03/21/2005  . Flu Shot  11/29/2015  . Colon Cancer Screening  01/10/2016  . Mammogram  04/07/2016  . Tetanus Vaccine  03/29/2020  . DEXA scan (bone density measurement)  Completed  . Pneumonia vaccines  Completed   Preventive Care for Adults  A healthy lifestyle and preventive care can promote health and wellness. Preventive health guidelines for adults include the following key practices.  . A routine yearly physical is a good way to check with your health care provider about your health and preventive screening. It is a chance to share any concerns and updates on your health and to receive a thorough exam.  . Visit your dentist for a routine exam and preventive care every 6 months. Brush your teeth twice a day and floss once a day. Good oral hygiene prevents tooth decay and gum disease.  . The frequency of eye exams is based on your age, health, family medical history, use  of contact lenses, and other factors. Follow your health care provider's ecommendations for frequency of eye exams.  . Eat a healthy diet. Foods like vegetables, fruits, whole grains, low-fat dairy products, and lean protein foods contain the nutrients you need without too many calories. Decrease your intake of foods high in solid fats, added sugars, and salt.  Eat the right amount of calories for you. Get information about a proper diet from your health care provider, if necessary.  . Regular physical exercise is one of the most important things you can do for your health. Most adults should get at least 150 minutes of moderate-intensity exercise (any activity that increases your heart rate and causes you to sweat) each week. In addition, most adults need muscle-strengthening exercises on 2 or more days a week.  Silver Sneakers may be a benefit available to you. To determine eligibility, you may visit the website: www.silversneakers.com or contact program at [phone] Mon-Fri between 8AM-8PM.   . Maintain a healthy weight. The body mass index (BMI) is a screening tool to identify possible weight problems. It provides an estimate of body fat based on height and weight. Your health care provider can find your BMI and can help you achieve or maintain a healthy weight.   For adults 20 years and older: ? A BMI below 18.5 is considered underweight. ? A BMI of 18.5 to 24.9 is normal. ? A BMI of 25 to 29.9 is considered overweight. ? A BMI of 30 and above is considered obese.   . Maintain normal blood lipids and cholesterol levels by exercising and minimizing your intake of saturated fat. Eat a balanced diet with plenty of fruit and vegetables. Blood tests for lipids and cholesterol should begin  at age 25 and be repeated every 5 years. If your lipid or cholesterol levels are high, you are over 50, or you are at high risk for heart disease, you may need your cholesterol levels checked more frequently. Ongoing high lipid and cholesterol levels should be treated with medicines if diet and exercise are not working.  . If you smoke, find out from your health care provider how to quit. If you do not use tobacco, please do not start.  . If you choose to drink alcohol, please do not consume more than 2 drinks per day. One drink is considered to be 12 ounces (355  mL) of beer, 5 ounces (148 mL) of wine, or 1.5 ounces (44 mL) of liquor.  . If you are 69-21 years old, ask your health care provider if you should take aspirin to prevent strokes.  . Use sunscreen. Apply sunscreen liberally and repeatedly throughout the day. You should seek shade when your shadow is shorter than you. Protect yourself by wearing long sleeves, pants, a wide-brimmed hat, and sunglasses year round, whenever you are outdoors.  . Once a month, do a whole body skin exam, using a mirror to look at the skin on your back. Tell your health care provider of new moles, moles that have irregular borders, moles that are larger than a pencil eraser, or moles that have changed in shape or color.

## 2016-05-10 ENCOUNTER — Other Ambulatory Visit: Payer: Self-pay | Admitting: Physician Assistant

## 2016-05-10 DIAGNOSIS — M170 Bilateral primary osteoarthritis of knee: Secondary | ICD-10-CM

## 2016-06-07 ENCOUNTER — Telehealth: Payer: Self-pay | Admitting: Family Medicine

## 2016-06-07 NOTE — Telephone Encounter (Signed)
Called Pt to schedule AWV with [facility] - knb °

## 2016-07-26 DIAGNOSIS — S43002A Unspecified subluxation of left shoulder joint, initial encounter: Secondary | ICD-10-CM | POA: Diagnosis not present

## 2016-07-26 DIAGNOSIS — M19012 Primary osteoarthritis, left shoulder: Secondary | ICD-10-CM | POA: Diagnosis not present

## 2016-07-26 DIAGNOSIS — M5432 Sciatica, left side: Secondary | ICD-10-CM | POA: Diagnosis not present

## 2016-07-26 DIAGNOSIS — M7502 Adhesive capsulitis of left shoulder: Secondary | ICD-10-CM | POA: Diagnosis not present

## 2016-07-26 DIAGNOSIS — M7989 Other specified soft tissue disorders: Secondary | ICD-10-CM | POA: Diagnosis not present

## 2016-07-31 DIAGNOSIS — M75122 Complete rotator cuff tear or rupture of left shoulder, not specified as traumatic: Secondary | ICD-10-CM | POA: Diagnosis not present

## 2016-07-31 DIAGNOSIS — M19012 Primary osteoarthritis, left shoulder: Secondary | ICD-10-CM | POA: Diagnosis not present

## 2016-10-02 DIAGNOSIS — M5442 Lumbago with sciatica, left side: Secondary | ICD-10-CM | POA: Diagnosis not present

## 2016-10-02 DIAGNOSIS — G8929 Other chronic pain: Secondary | ICD-10-CM | POA: Diagnosis not present

## 2016-10-02 DIAGNOSIS — M47816 Spondylosis without myelopathy or radiculopathy, lumbar region: Secondary | ICD-10-CM | POA: Diagnosis not present

## 2016-10-02 DIAGNOSIS — M5136 Other intervertebral disc degeneration, lumbar region: Secondary | ICD-10-CM | POA: Diagnosis not present

## 2016-10-12 DIAGNOSIS — M5442 Lumbago with sciatica, left side: Secondary | ICD-10-CM | POA: Diagnosis not present

## 2016-10-12 DIAGNOSIS — G8929 Other chronic pain: Secondary | ICD-10-CM | POA: Diagnosis not present

## 2016-10-22 DIAGNOSIS — G8929 Other chronic pain: Secondary | ICD-10-CM | POA: Diagnosis not present

## 2016-10-22 DIAGNOSIS — M5442 Lumbago with sciatica, left side: Secondary | ICD-10-CM | POA: Diagnosis not present

## 2016-10-26 DIAGNOSIS — M4726 Other spondylosis with radiculopathy, lumbar region: Secondary | ICD-10-CM | POA: Diagnosis not present

## 2016-10-26 DIAGNOSIS — M48061 Spinal stenosis, lumbar region without neurogenic claudication: Secondary | ICD-10-CM | POA: Diagnosis not present

## 2016-10-26 DIAGNOSIS — M5442 Lumbago with sciatica, left side: Secondary | ICD-10-CM | POA: Diagnosis not present

## 2016-10-26 DIAGNOSIS — M47816 Spondylosis without myelopathy or radiculopathy, lumbar region: Secondary | ICD-10-CM | POA: Diagnosis not present

## 2016-11-05 DIAGNOSIS — G8929 Other chronic pain: Secondary | ICD-10-CM | POA: Diagnosis not present

## 2016-11-05 DIAGNOSIS — M5442 Lumbago with sciatica, left side: Secondary | ICD-10-CM | POA: Diagnosis not present

## 2016-11-12 DIAGNOSIS — G8929 Other chronic pain: Secondary | ICD-10-CM | POA: Diagnosis not present

## 2016-11-12 DIAGNOSIS — M5442 Lumbago with sciatica, left side: Secondary | ICD-10-CM | POA: Diagnosis not present

## 2016-11-23 DIAGNOSIS — M5417 Radiculopathy, lumbosacral region: Secondary | ICD-10-CM | POA: Diagnosis not present

## 2016-11-23 DIAGNOSIS — M5126 Other intervertebral disc displacement, lumbar region: Secondary | ICD-10-CM | POA: Diagnosis not present

## 2017-01-25 DIAGNOSIS — M79605 Pain in left leg: Secondary | ICD-10-CM | POA: Diagnosis not present

## 2017-01-25 DIAGNOSIS — M1611 Unilateral primary osteoarthritis, right hip: Secondary | ICD-10-CM | POA: Diagnosis not present

## 2017-01-25 DIAGNOSIS — M25552 Pain in left hip: Secondary | ICD-10-CM | POA: Diagnosis not present

## 2017-01-25 DIAGNOSIS — M1612 Unilateral primary osteoarthritis, left hip: Secondary | ICD-10-CM | POA: Diagnosis not present

## 2017-01-28 ENCOUNTER — Other Ambulatory Visit: Payer: Self-pay | Admitting: Physician Assistant

## 2017-01-28 DIAGNOSIS — B372 Candidiasis of skin and nail: Secondary | ICD-10-CM

## 2017-01-31 DIAGNOSIS — M1712 Unilateral primary osteoarthritis, left knee: Secondary | ICD-10-CM | POA: Diagnosis not present

## 2017-01-31 DIAGNOSIS — M5442 Lumbago with sciatica, left side: Secondary | ICD-10-CM | POA: Diagnosis not present

## 2017-01-31 DIAGNOSIS — M25562 Pain in left knee: Secondary | ICD-10-CM | POA: Diagnosis not present

## 2017-01-31 DIAGNOSIS — M5136 Other intervertebral disc degeneration, lumbar region: Secondary | ICD-10-CM | POA: Diagnosis not present

## 2017-01-31 DIAGNOSIS — G8929 Other chronic pain: Secondary | ICD-10-CM | POA: Diagnosis not present

## 2017-03-18 ENCOUNTER — Telehealth: Payer: Self-pay | Admitting: Family Medicine

## 2017-04-03 NOTE — Telephone Encounter (Signed)
LM for pt to CB and schedule AWV with [facility] and CPE. -MM

## 2017-04-12 ENCOUNTER — Telehealth: Payer: Self-pay | Admitting: Family Medicine

## 2017-04-18 DIAGNOSIS — H524 Presbyopia: Secondary | ICD-10-CM | POA: Diagnosis not present

## 2017-04-18 DIAGNOSIS — H52223 Regular astigmatism, bilateral: Secondary | ICD-10-CM | POA: Diagnosis not present

## 2017-04-18 DIAGNOSIS — H5203 Hypermetropia, bilateral: Secondary | ICD-10-CM | POA: Diagnosis not present

## 2017-04-18 DIAGNOSIS — H2513 Age-related nuclear cataract, bilateral: Secondary | ICD-10-CM | POA: Diagnosis not present

## 2017-05-02 NOTE — Telephone Encounter (Signed)
Visit completed 03/2017

## 2017-06-07 ENCOUNTER — Ambulatory Visit (INDEPENDENT_AMBULATORY_CARE_PROVIDER_SITE_OTHER): Payer: Medicare Other | Admitting: Family Medicine

## 2017-06-07 ENCOUNTER — Encounter: Payer: Self-pay | Admitting: Family Medicine

## 2017-06-07 VITALS — BP 142/80 | HR 89 | Temp 97.8°F | Resp 16 | Wt 222.0 lb

## 2017-06-07 DIAGNOSIS — J0110 Acute frontal sinusitis, unspecified: Secondary | ICD-10-CM

## 2017-06-07 MED ORDER — AMOXICILLIN 500 MG PO CAPS: 1000.0000 mg | ORAL_CAPSULE | Freq: Two times a day (BID) | ORAL | 0 refills | Status: AC

## 2017-06-07 MED ORDER — HYDROCODONE-HOMATROPINE 5-1.5 MG/5ML PO SYRP: 5.0000 mL | ORAL_SOLUTION | Freq: Three times a day (TID) | ORAL | 0 refills | Status: DC | PRN

## 2017-06-07 NOTE — Progress Notes (Signed)
Patient: Olivia [name] Female    DOB: 1945-12-25   72 y.o.   MRN: [id number] Visit Date: 06/07/2017  Today's Provider: Lelon Huh, MD   Chief Complaint  Patient presents with  . Cough   Subjective:    Cough  This is a new problem. Episode onset: 10 days ago. The problem has been unchanged. The cough is productive of sputum (bright yellow-clear). Associated symptoms include ear congestion, ear pain, headaches, nasal congestion, postnasal drip, rhinorrhea, a sore throat (improved), sweats (mostly at night) and wheezing. Pertinent negatives include no chest pain, chills, fever, hemoptysis, myalgias or shortness of breath. Treatments tried: Delsym, [place], Mucinex. The treatment provided mild relief.  Was sent in prescription for      No Known Allergies   Current Outpatient Medications:  .  Ascorbic Acid (VITAMIN C) 100 MG tablet, Take 100 mg by mouth daily., Disp: , Rfl:  .  ibuprofen (ADVIL,MOTRIN) 200 MG tablet, Take 200 mg by mouth every 6 (six) hours as needed., Disp: , Rfl:  .  Multiple Vitamin (MULTIVITAMIN) capsule, Take 1 capsule by mouth daily. , Disp: , Rfl:  .  omeprazole (PRILOSEC) 20 MG capsule, Take 1 capsule (20 mg total) by mouth daily. (Patient not taking: Reported on 06/07/2017), Disp: 30 capsule, Rfl: 3  Review of Systems  Constitutional: Positive for fatigue. Negative for appetite change, chills and fever.  HENT: Positive for congestion, ear pain, postnasal drip, rhinorrhea, sneezing and sore throat (improved). Negative for nosebleeds, sinus pressure and sinus pain.   Respiratory: Positive for cough and wheezing. Negative for hemoptysis, chest tightness and shortness of breath.   Cardiovascular: Negative for chest pain and palpitations.  Gastrointestinal: Negative for abdominal pain, nausea and vomiting.  Musculoskeletal: Negative for myalgias.  Neurological: Positive for headaches. Negative for dizziness and weakness.    Social History    Tobacco Use  . Smoking status: Never Smoker  . Smokeless tobacco: Never Used  Substance Use Topics  . Alcohol use: Yes    Alcohol/week: 0.0 oz    Comment: occasional use   Objective:   BP (!) 142/80 (BP Location: Left Arm, Patient Position: Sitting, Cuff Size: Large)   Pulse 89   Temp 97.8 F (36.6 C) (Oral)   Resp 16   Wt 222 lb (100.7 kg)   SpO2 97% Comment: room air  BMI 40.60 kg/m  There were no vitals filed for this visit.   Physical Exam  General Appearance:    Alert, cooperative, no distress  HENT:   bilateral TM normal without fluid or infection, neck without nodes, throat normal without erythema or exudate, frontal and maxillary sinuses tender and nasal mucosa congested  Eyes:    PERRL, conjunctiva/corneas clear, EOM's intact       Lungs:     Clear to auscultation bilaterally, respirations unlabored  Heart:    Regular rate and rhythm  Neurologic:   Awake, alert, oriented x 3. No apparent focal neurological           defect.           Assessment & Plan:     1. Acute frontal sinusitis, recurrence not specified  - amoxicillin (AMOXIL) 500 MG capsule; Take 2 capsules (1,000 mg total) by mouth 2 (two) times daily for 7 days.  Dispense: 28 capsule; Refill: 0 - HYDROcodone-homatropine (HYCODAN) 5-1.5 MG/5ML syrup; Take 5 mLs by mouth every 8 (eight) hours as needed.  Dispense: 100 mL; Refill: 0  Lelon Huh, MD  [facility]

## 2017-07-03 ENCOUNTER — Telehealth: Payer: Self-pay | Admitting: Family Medicine

## 2017-07-09 DIAGNOSIS — M170 Bilateral primary osteoarthritis of knee: Secondary | ICD-10-CM | POA: Diagnosis not present

## 2017-08-01 NOTE — Telephone Encounter (Signed)
complete

## 2017-08-14 ENCOUNTER — Telehealth: Payer: Self-pay | Admitting: Family Medicine

## 2017-08-14 NOTE — Telephone Encounter (Signed)
Left patient a message to return my call for an appt. °

## 2017-09-25 ENCOUNTER — Other Ambulatory Visit: Payer: Self-pay | Admitting: Family Medicine

## 2017-09-25 DIAGNOSIS — Z1231 Encounter for screening mammogram for malignant neoplasm of breast: Secondary | ICD-10-CM

## 2017-10-11 ENCOUNTER — Ambulatory Visit
Admission: RE | Admit: 2017-10-11 | Discharge: 2017-10-11 | Disposition: A | Discharge status: Home / Self Care | Payer: Medicare Other | Source: Ambulatory Visit | Attending: Family Medicine | Admitting: Family Medicine

## 2017-10-11 DIAGNOSIS — Z1231 Encounter for screening mammogram for malignant neoplasm of breast: Secondary | ICD-10-CM | POA: Insufficient documentation

## 2017-11-25 DIAGNOSIS — M170 Bilateral primary osteoarthritis of knee: Secondary | ICD-10-CM | POA: Diagnosis not present

## 2017-12-13 ENCOUNTER — Telehealth: Payer: Self-pay | Admitting: Family Medicine

## 2017-12-13 NOTE — Telephone Encounter (Signed)
I left a message asking the pt to call me at [phone] to schedule AWV w/ [facility]. Last AWV 04/13/16. VDM (DD)

## 2018-01-22 ENCOUNTER — Encounter: Payer: Self-pay | Admitting: Family Medicine

## 2018-01-22 ENCOUNTER — Ambulatory Visit (INDEPENDENT_AMBULATORY_CARE_PROVIDER_SITE_OTHER): Payer: Medicare Other | Admitting: Family Medicine

## 2018-01-22 VITALS — BP 138/79 | HR 86 | Temp 98.0°F | Resp 16 | Wt 203.0 lb

## 2018-01-22 DIAGNOSIS — L989 Disorder of the skin and subcutaneous tissue, unspecified: Secondary | ICD-10-CM

## 2018-01-22 MED ORDER — MUPIROCIN 2 % EX OINT: 1.0000 "application " | TOPICAL_OINTMENT | Freq: Two times a day (BID) | CUTANEOUS | 0 refills | Status: DC

## 2018-01-22 NOTE — Progress Notes (Signed)
       Patient: Olivia [name] Female    DOB: 1946-04-12   72 y.o.   MRN: [id number] Visit Date: 01/22/2018  Today's Provider: Lelon Huh, MD   Chief Complaint  Patient presents with  . Insect Bite   Subjective:    HPI  Patient states she has had a sore on her left ankle for 1 week. Patient believes a mosquito bit her, but she is not sure. There are several sores around the ankle. Patient states sores itch, burn and are sore to the touch. Patient has been treating sores with neosporin, Vicks salve, and Benadryl cream with only mild relief.    No Known Allergies   Current Outpatient Medications:  .  Ascorbic Acid (VITAMIN C) 100 MG tablet, Take 100 mg by mouth daily., Disp: , Rfl:  .  ibuprofen (ADVIL,MOTRIN) 200 MG tablet, Take 200 mg by mouth every 6 (six) hours as needed., Disp: , Rfl:  .  Multiple Vitamin (MULTIVITAMIN) capsule, Take 1 capsule by mouth daily. , Disp: , Rfl:  .  traMADol (ULTRAM) 50 MG tablet, Take 50 mg by mouth daily., Disp: , Rfl:  .  HYDROcodone-homatropine (HYCODAN) 5-1.5 MG/5ML syrup, Take 5 mLs by mouth every 8 (eight) hours as needed. (Patient not taking: Reported on 01/22/2018), Disp: 100 mL, Rfl: 0 .  omeprazole (PRILOSEC) 20 MG capsule, Take 1 capsule (20 mg total) by mouth daily. (Patient not taking: Reported on 01/22/2018), Disp: 30 capsule, Rfl: 3  Review of Systems  Constitutional: Negative for appetite change, chills, fatigue and fever.  Respiratory: Negative for chest tightness and shortness of breath.   Cardiovascular: Negative for chest pain and palpitations.  Gastrointestinal: Negative for abdominal pain, nausea and vomiting.  Neurological: Negative for dizziness and weakness.    Social History   Tobacco Use  . Smoking status: Never Smoker  . Smokeless tobacco: Never Used  Substance Use Topics  . Alcohol use: Yes    Alcohol/week: 0.0 standard drinks    Comment: occasional use   Objective:   BP 138/79 (BP Location: Right  Arm, Patient Position: Sitting, Cuff Size: Large)   Pulse 86   Temp 98 F (36.7 C) (Oral)   Resp 16   Wt 203 lb (92.1 kg)   SpO2 95%   BMI 37.13 kg/m  Vitals:   01/22/18 1139  BP: 138/79  Pulse: 86  Resp: 16  Temp: 98 F (36.7 C)  TempSrc: Oral  SpO2: 95%  Weight: 203 lb (92.1 kg)     Physical Exam  General appearance: alert, well developed, well nourished, cooperative and in no distress Head: Normocephalic, without obvious abnormality, atraumatic Skin: 3 nickel sized patches of blanching erythematous slightly scaly appearing, well demarcated lesion posterior left heel and ankle. No discharge.      Assessment & Plan:     1. Skin lesion Cover for infectious versus inflammatory lesions. Prescription mupirocin ointment and to apply OTC 1% cortisone before each application of mupirocin.   Call for dermatology referral if not improving within 7-10 days.        Lelon Huh, MD  [facility]

## 2018-03-20 DIAGNOSIS — M1712 Unilateral primary osteoarthritis, left knee: Secondary | ICD-10-CM | POA: Diagnosis not present

## 2018-03-20 DIAGNOSIS — M170 Bilateral primary osteoarthritis of knee: Secondary | ICD-10-CM | POA: Diagnosis not present

## 2018-09-01 DIAGNOSIS — M170 Bilateral primary osteoarthritis of knee: Secondary | ICD-10-CM | POA: Diagnosis not present

## 2018-12-23 DIAGNOSIS — M170 Bilateral primary osteoarthritis of knee: Secondary | ICD-10-CM | POA: Diagnosis not present

## 2018-12-23 DIAGNOSIS — M25562 Pain in left knee: Secondary | ICD-10-CM | POA: Diagnosis not present

## 2018-12-23 DIAGNOSIS — M25561 Pain in right knee: Secondary | ICD-10-CM | POA: Diagnosis not present

## 2018-12-23 DIAGNOSIS — M25462 Effusion, left knee: Secondary | ICD-10-CM | POA: Diagnosis not present

## 2018-12-23 DIAGNOSIS — M25461 Effusion, right knee: Secondary | ICD-10-CM | POA: Diagnosis not present

## 2018-12-31 ENCOUNTER — Encounter: Payer: Self-pay | Admitting: Family Medicine

## 2018-12-31 ENCOUNTER — Other Ambulatory Visit: Payer: Self-pay

## 2018-12-31 ENCOUNTER — Ambulatory Visit (INDEPENDENT_AMBULATORY_CARE_PROVIDER_SITE_OTHER): Payer: Medicare Other | Admitting: Family Medicine

## 2018-12-31 VITALS — BP 112/70 | HR 77 | Temp 97.5°F | Resp 15 | Wt 203.0 lb

## 2018-12-31 DIAGNOSIS — Z6838 Body mass index (BMI) 38.0-38.9, adult: Secondary | ICD-10-CM

## 2018-12-31 DIAGNOSIS — M170 Bilateral primary osteoarthritis of knee: Secondary | ICD-10-CM | POA: Diagnosis not present

## 2018-12-31 DIAGNOSIS — Z01818 Encounter for other preprocedural examination: Secondary | ICD-10-CM

## 2018-12-31 DIAGNOSIS — G4730 Sleep apnea, unspecified: Secondary | ICD-10-CM | POA: Diagnosis not present

## 2018-12-31 NOTE — Progress Notes (Signed)
Patient: Olivia [name] Female    DOB: 01-02-1946   73 y.o.   MRN: [id number] Visit Date: 12/31/2018  Today's Provider: Lelon Huh, MD   Chief Complaint  Patient presents with  . Pre-op Exam   Subjective:     HPI  Patient comes into office today for surgical clearance examination. Patient is schedule to have a left right knee replacement performed by Dr. Lyndel Pleasure. Patient reports that replacement is due to loss of cartilage in her knee from a meniscus tear several years ago.   Past Surgical History:  Procedure Laterality Date  . ABDOMINAL HYSTERECTOMY  1976   inverted Uterus. No cancer  . APPENDECTOMY    . CHOLECYSTECTOMY  2003 ?  . COLON SURGERY  05/15/13   Sigmoid colectomy for focal diverticulosis/diverticulitis with contained abscess.  . COLONOSCOPY  01-09-13   Dr Donnella Sham  . FOOT SURGERY Right 2000  . KNEE SURGERY Right 2004   arthroscopy of knee; torn meniscus  . UPPER GI ENDOSCOPY  01-09-13   Dr Donnella Sham. Antral biopsy with minimal gastritis   Denis any history or surgical or anesthetic complications and no family history of anesthetic complications.   Denies chest pain, palpitations, dyspnea, orthopnea or PND. Physical activity is only limited by knee pain. Denies any unusual bleeding or bruising.    No Known Allergies   Current Outpatient Medications:  Marland Kitchen  Multiple Vitamin (MULTIVITAMIN) capsule, Take 1 capsule by mouth daily. , Disp: , Rfl:  .  Ascorbic Acid (VITAMIN C) 100 MG tablet, Take 100 mg by mouth daily., Disp: , Rfl:  .  ibuprofen (ADVIL,MOTRIN) 200 MG tablet, Take 200 mg by mouth every 6 (six) hours as needed., Disp: , Rfl:  .  traMADol (ULTRAM) 50 MG tablet, Take 50 mg by mouth daily., Disp: , Rfl:   Review of Systems  Constitutional: Negative.   HENT: Positive for rhinorrhea, sinus pressure and sinus pain.   Respiratory: Negative.   Cardiovascular: Negative.   Gastrointestinal: Negative.   Genitourinary: Negative.   Musculoskeletal:  Positive for arthralgias, joint swelling and myalgias. Negative for back pain.  Neurological: Negative.   Hematological: Negative.     Social History   Tobacco Use  . Smoking status: Never Smoker  . Smokeless tobacco: Never Used  Substance Use Topics  . Alcohol use: Yes    Alcohol/week: 0.0 standard drinks    Comment: occasional use      Objective:   BP 112/70   Pulse 77   Temp (!) 97.5 F (36.4 C) (Oral)   Resp 15   Wt 203 lb (92.1 kg)   SpO2 99%   BMI 37.13 kg/m  Vitals:   12/31/18 0818  BP: 112/70  Pulse: 77  Resp: 15  Temp: (!) 97.5 F (36.4 C)  TempSrc: Oral  SpO2: 99%  Weight: 203 lb (92.1 kg)  Body mass index is 37.13 kg/m.   Physical Exam    General Appearance:    Alert, cooperative, no distress  Eyes:    PERRL, conjunctiva/corneas clear, EOM's intact       Lungs:     Clear to auscultation bilaterally, respirations unlabored  Heart:    Normal heart rate. Normal rhythm. No murmurs, rubs, or gallops.   MS:   All extremities are intact.   Neurologic:   Awake, alert, oriented x 3. No apparent focal neurological           defect.  Assessment & Plan    1. Pre-op exam  Patient has no absolute or relative contraindications to planned surgery and anesthesia including general anesthesia. He is at low risk for cardiac and pulmonary complications. Anticipates pre-op labs being done at upcoming appointment. If labs normal then no additional medical precautions recommended.   - EKG 12-Lead - Comprehensive metabolic panel - CBC - PT and PTT  2. Arthritis of both knees   3. Sleep apnea, unspecified type Intolerant to CPAP  4. BMI 38.0-38.9,adult Encourage weight lose to improve orthopedic surgical outcome     Lelon Huh, MD  [facility]

## 2019-01-01 LAB — PT AND PTT
INR: 1 (ref 0.8–1.2)
Prothrombin Time: 10.5 s (ref 9.1–12.0)
aPTT: 27 s (ref 24–33)

## 2019-01-01 LAB — CBC
Hematocrit: 40.2 % (ref 34.0–46.6)
Hemoglobin: 13.5 g/dL (ref 11.1–15.9)
MCH: 30.8 pg (ref 26.6–33.0)
MCHC: 33.6 g/dL (ref 31.5–35.7)
MCV: 92 fL (ref 79–97)
Platelets: 247 10*3/uL (ref 150–450)
RBC: 4.38 x10E6/uL (ref 3.77–5.28)
RDW: 12.2 % (ref 11.7–15.4)
WBC: 5.9 10*3/uL (ref 3.4–10.8)

## 2019-01-01 LAB — COMPREHENSIVE METABOLIC PANEL
ALT: 15 IU/L (ref 0–32)
AST: 15 IU/L (ref 0–40)
Albumin/Globulin Ratio: 1.7 (ref 1.2–2.2)
Albumin: 4.1 g/dL (ref 3.7–4.7)
Alkaline Phosphatase: 71 IU/L (ref 39–117)
BUN/Creatinine Ratio: 16 (ref 12–28)
BUN: 13 mg/dL (ref 8–27)
Bilirubin Total: 0.3 mg/dL (ref 0.0–1.2)
CO2: 23 mmol/L (ref 20–29)
Calcium: 9.4 mg/dL (ref 8.7–10.3)
Chloride: 107 mmol/L — ABNORMAL HIGH (ref 96–106)
Creatinine, Ser: 0.81 mg/dL (ref 0.57–1.00)
GFR calc Af Amer: 83 mL/min/{1.73_m2} (ref >59)
GFR calc non Af Amer: 72 mL/min/{1.73_m2} (ref >59)
Globulin, Total: 2.4 g/dL (ref 1.5–4.5)
Glucose: 81 mg/dL (ref 65–99)
Potassium: 4.4 mmol/L (ref 3.5–5.2)
Sodium: 142 mmol/L (ref 134–144)
Total Protein: 6.5 g/dL (ref 6.0–8.5)

## 2019-01-06 ENCOUNTER — Telehealth: Payer: Self-pay

## 2019-01-06 NOTE — Telephone Encounter (Signed)
Patient advised.KW 

## 2019-01-06 NOTE — Telephone Encounter (Signed)
-----   Message from Birdie Sons, MD sent at 01/02/2019  4:52 PM EDT ----- Labs are all normal. Have sent clearance for surgery to her orthopedist.

## 2019-01-10 NOTE — Patient Instructions (Signed)
.   Please review the attached list of medications and notify my office if there are any errors.   . Please bring all of your medications to every appointment so we can make sure that our medication list is the same as yours.   . It is especially important to get the annual flu vaccine this year. If you haven't had it already, please go to your pharmacy or call the office as soon as possible to schedule you flu shot.  

## 2019-02-11 DIAGNOSIS — M170 Bilateral primary osteoarthritis of knee: Secondary | ICD-10-CM | POA: Diagnosis not present

## 2019-03-02 DIAGNOSIS — Z5181 Encounter for therapeutic drug level monitoring: Secondary | ICD-10-CM | POA: Diagnosis not present

## 2019-03-02 DIAGNOSIS — M1711 Unilateral primary osteoarthritis, right knee: Secondary | ICD-10-CM | POA: Diagnosis not present

## 2019-03-02 DIAGNOSIS — Z01812 Encounter for preprocedural laboratory examination: Secondary | ICD-10-CM | POA: Diagnosis not present

## 2019-03-02 DIAGNOSIS — Z9189 Other specified personal risk factors, not elsewhere classified: Secondary | ICD-10-CM | POA: Diagnosis not present

## 2019-03-02 DIAGNOSIS — Z01818 Encounter for other preprocedural examination: Secondary | ICD-10-CM | POA: Diagnosis not present

## 2019-03-02 DIAGNOSIS — M21061 Valgus deformity, not elsewhere classified, right knee: Secondary | ICD-10-CM | POA: Diagnosis not present

## 2019-03-02 DIAGNOSIS — M25561 Pain in right knee: Secondary | ICD-10-CM | POA: Diagnosis not present

## 2019-03-02 DIAGNOSIS — Z7901 Long term (current) use of anticoagulants: Secondary | ICD-10-CM | POA: Diagnosis not present

## 2019-03-04 DIAGNOSIS — Z01812 Encounter for preprocedural laboratory examination: Secondary | ICD-10-CM | POA: Diagnosis not present

## 2019-03-06 DIAGNOSIS — Z471 Aftercare following joint replacement surgery: Secondary | ICD-10-CM | POA: Diagnosis not present

## 2019-03-06 DIAGNOSIS — Z96651 Presence of right artificial knee joint: Secondary | ICD-10-CM | POA: Diagnosis not present

## 2019-03-06 DIAGNOSIS — Z9049 Acquired absence of other specified parts of digestive tract: Secondary | ICD-10-CM | POA: Diagnosis not present

## 2019-03-06 DIAGNOSIS — G8918 Other acute postprocedural pain: Secondary | ICD-10-CM | POA: Diagnosis not present

## 2019-03-06 DIAGNOSIS — M1711 Unilateral primary osteoarthritis, right knee: Secondary | ICD-10-CM | POA: Diagnosis not present

## 2019-03-06 DIAGNOSIS — R2689 Other abnormalities of gait and mobility: Secondary | ICD-10-CM | POA: Diagnosis not present

## 2019-03-06 DIAGNOSIS — Z791 Long term (current) use of non-steroidal anti-inflammatories (NSAID): Secondary | ICD-10-CM | POA: Diagnosis not present

## 2019-03-06 DIAGNOSIS — M25461 Effusion, right knee: Secondary | ICD-10-CM | POA: Diagnosis not present

## 2019-03-06 DIAGNOSIS — Z01812 Encounter for preprocedural laboratory examination: Secondary | ICD-10-CM | POA: Diagnosis not present

## 2019-03-06 DIAGNOSIS — M25561 Pain in right knee: Secondary | ICD-10-CM | POA: Diagnosis not present

## 2019-03-06 DIAGNOSIS — G4733 Obstructive sleep apnea (adult) (pediatric): Secondary | ICD-10-CM | POA: Diagnosis not present

## 2019-03-06 HISTORY — PX: TOTAL KNEE ARTHROPLASTY: SHX125

## 2019-03-09 ENCOUNTER — Encounter: Payer: Self-pay | Admitting: Family Medicine

## 2019-03-09 MED ORDER — LACTATED RINGERS IV SOLN
20.00 | INTRAVENOUS | Status: DC
Start: ? — End: 2019-03-09

## 2019-03-09 MED ORDER — LACTATED RINGERS IV SOLN
100.00 | INTRAVENOUS | Status: DC
Start: ? — End: 2019-03-09

## 2019-03-09 MED ORDER — PANTOPRAZOLE SODIUM 20 MG PO TBEC
20.00 | DELAYED_RELEASE_TABLET | ORAL | Status: DC
Start: 2019-03-10 — End: 2019-03-09

## 2019-03-09 MED ORDER — PHENOL 1.4 % MT LIQD
2.00 | OROMUCOSAL | Status: DC
Start: ? — End: 2019-03-09

## 2019-03-09 MED ORDER — GENERIC EXTERNAL MEDICATION
Status: DC
Start: ? — End: 2019-03-09

## 2019-03-09 MED ORDER — ACETAMINOPHEN 500 MG PO TABS
1000.00 | ORAL_TABLET | ORAL | Status: DC
Start: 2019-03-09 — End: 2019-03-09

## 2019-03-09 MED ORDER — CELECOXIB 100 MG PO CAPS
100.00 | ORAL_CAPSULE | ORAL | Status: DC
Start: 2019-03-10 — End: 2019-03-09

## 2019-03-09 MED ORDER — SENNOSIDES 8.6 MG PO TABS
2.00 | ORAL_TABLET | ORAL | Status: DC
Start: ? — End: 2019-03-09

## 2019-03-09 MED ORDER — MENTHOL 9.1 MG MT LOZG
1.00 | LOZENGE | OROMUCOSAL | Status: DC
Start: ? — End: 2019-03-09

## 2019-03-09 MED ORDER — PREGABALIN 75 MG PO CAPS
75.00 | ORAL_CAPSULE | ORAL | Status: DC
Start: 2019-03-10 — End: 2019-03-09

## 2019-03-09 MED ORDER — GENERIC EXTERNAL MEDICATION
120.00 | Status: DC
Start: ? — End: 2019-03-09

## 2019-03-09 MED ORDER — POLYETHYLENE GLYCOL 3350 17 G PO PACK
17.00 | PACK | ORAL | Status: DC
Start: 2019-03-10 — End: 2019-03-09

## 2019-03-09 MED ORDER — ASPIRIN 81 MG PO CHEW
81.00 | CHEWABLE_TABLET | ORAL | Status: DC
Start: 2019-03-09 — End: 2019-03-09

## 2019-03-09 MED ORDER — NALOXONE HCL 4 MG/10ML IJ SOLN
0.10 | INTRAMUSCULAR | Status: DC
Start: ? — End: 2019-03-09

## 2019-03-09 MED ORDER — MELATONIN 3 MG PO TABS
3.00 | ORAL_TABLET | ORAL | Status: DC
Start: ? — End: 2019-03-09

## 2019-03-10 DIAGNOSIS — Z96651 Presence of right artificial knee joint: Secondary | ICD-10-CM | POA: Diagnosis not present

## 2019-03-10 DIAGNOSIS — Z471 Aftercare following joint replacement surgery: Secondary | ICD-10-CM | POA: Diagnosis not present

## 2019-03-10 DIAGNOSIS — G4730 Sleep apnea, unspecified: Secondary | ICD-10-CM | POA: Diagnosis not present

## 2019-03-12 DIAGNOSIS — Z471 Aftercare following joint replacement surgery: Secondary | ICD-10-CM | POA: Diagnosis not present

## 2019-03-12 DIAGNOSIS — Z96651 Presence of right artificial knee joint: Secondary | ICD-10-CM | POA: Diagnosis not present

## 2019-03-12 DIAGNOSIS — G4730 Sleep apnea, unspecified: Secondary | ICD-10-CM | POA: Diagnosis not present

## 2019-03-14 DIAGNOSIS — G4730 Sleep apnea, unspecified: Secondary | ICD-10-CM | POA: Diagnosis not present

## 2019-03-14 DIAGNOSIS — Z96651 Presence of right artificial knee joint: Secondary | ICD-10-CM | POA: Diagnosis not present

## 2019-03-14 DIAGNOSIS — Z471 Aftercare following joint replacement surgery: Secondary | ICD-10-CM | POA: Diagnosis not present

## 2019-03-16 DIAGNOSIS — G4730 Sleep apnea, unspecified: Secondary | ICD-10-CM | POA: Diagnosis not present

## 2019-03-16 DIAGNOSIS — Z471 Aftercare following joint replacement surgery: Secondary | ICD-10-CM | POA: Diagnosis not present

## 2019-03-16 DIAGNOSIS — Z96651 Presence of right artificial knee joint: Secondary | ICD-10-CM | POA: Diagnosis not present

## 2019-03-18 DIAGNOSIS — Z471 Aftercare following joint replacement surgery: Secondary | ICD-10-CM | POA: Diagnosis not present

## 2019-03-18 DIAGNOSIS — Z96651 Presence of right artificial knee joint: Secondary | ICD-10-CM | POA: Diagnosis not present

## 2019-03-18 DIAGNOSIS — G4730 Sleep apnea, unspecified: Secondary | ICD-10-CM | POA: Diagnosis not present

## 2019-03-20 DIAGNOSIS — Z471 Aftercare following joint replacement surgery: Secondary | ICD-10-CM | POA: Diagnosis not present

## 2019-03-20 DIAGNOSIS — G4730 Sleep apnea, unspecified: Secondary | ICD-10-CM | POA: Diagnosis not present

## 2019-03-20 DIAGNOSIS — Z96651 Presence of right artificial knee joint: Secondary | ICD-10-CM | POA: Diagnosis not present

## 2019-03-23 DIAGNOSIS — Z96651 Presence of right artificial knee joint: Secondary | ICD-10-CM | POA: Diagnosis not present

## 2019-03-23 DIAGNOSIS — Z471 Aftercare following joint replacement surgery: Secondary | ICD-10-CM | POA: Diagnosis not present

## 2019-03-23 DIAGNOSIS — G4730 Sleep apnea, unspecified: Secondary | ICD-10-CM | POA: Diagnosis not present

## 2019-03-24 DIAGNOSIS — Z471 Aftercare following joint replacement surgery: Secondary | ICD-10-CM | POA: Diagnosis not present

## 2019-03-24 DIAGNOSIS — Z96651 Presence of right artificial knee joint: Secondary | ICD-10-CM | POA: Diagnosis not present

## 2019-03-24 DIAGNOSIS — G4730 Sleep apnea, unspecified: Secondary | ICD-10-CM | POA: Diagnosis not present

## 2019-03-27 DIAGNOSIS — Z471 Aftercare following joint replacement surgery: Secondary | ICD-10-CM | POA: Diagnosis not present

## 2019-03-27 DIAGNOSIS — Z96651 Presence of right artificial knee joint: Secondary | ICD-10-CM | POA: Diagnosis not present

## 2019-03-27 DIAGNOSIS — G4730 Sleep apnea, unspecified: Secondary | ICD-10-CM | POA: Diagnosis not present

## 2019-03-30 DIAGNOSIS — Z96651 Presence of right artificial knee joint: Secondary | ICD-10-CM | POA: Diagnosis not present

## 2019-03-30 DIAGNOSIS — Z471 Aftercare following joint replacement surgery: Secondary | ICD-10-CM | POA: Diagnosis not present

## 2019-03-30 DIAGNOSIS — G4730 Sleep apnea, unspecified: Secondary | ICD-10-CM | POA: Diagnosis not present

## 2019-04-01 DIAGNOSIS — Z96651 Presence of right artificial knee joint: Secondary | ICD-10-CM | POA: Diagnosis not present

## 2019-04-01 DIAGNOSIS — Z471 Aftercare following joint replacement surgery: Secondary | ICD-10-CM | POA: Diagnosis not present

## 2019-04-01 DIAGNOSIS — G4730 Sleep apnea, unspecified: Secondary | ICD-10-CM | POA: Diagnosis not present

## 2019-04-06 DIAGNOSIS — G4730 Sleep apnea, unspecified: Secondary | ICD-10-CM | POA: Diagnosis not present

## 2019-04-06 DIAGNOSIS — Z96651 Presence of right artificial knee joint: Secondary | ICD-10-CM | POA: Diagnosis not present

## 2019-04-06 DIAGNOSIS — Z471 Aftercare following joint replacement surgery: Secondary | ICD-10-CM | POA: Diagnosis not present

## 2019-04-08 DIAGNOSIS — Z96651 Presence of right artificial knee joint: Secondary | ICD-10-CM | POA: Diagnosis not present

## 2019-04-08 DIAGNOSIS — G4730 Sleep apnea, unspecified: Secondary | ICD-10-CM | POA: Diagnosis not present

## 2019-04-08 DIAGNOSIS — Z471 Aftercare following joint replacement surgery: Secondary | ICD-10-CM | POA: Diagnosis not present

## 2019-04-13 DIAGNOSIS — Z96651 Presence of right artificial knee joint: Secondary | ICD-10-CM | POA: Diagnosis not present

## 2019-04-13 DIAGNOSIS — G4730 Sleep apnea, unspecified: Secondary | ICD-10-CM | POA: Diagnosis not present

## 2019-04-13 DIAGNOSIS — Z471 Aftercare following joint replacement surgery: Secondary | ICD-10-CM | POA: Diagnosis not present

## 2019-04-15 DIAGNOSIS — Z471 Aftercare following joint replacement surgery: Secondary | ICD-10-CM | POA: Diagnosis not present

## 2019-04-15 DIAGNOSIS — G4730 Sleep apnea, unspecified: Secondary | ICD-10-CM | POA: Diagnosis not present

## 2019-04-15 DIAGNOSIS — Z96651 Presence of right artificial knee joint: Secondary | ICD-10-CM | POA: Diagnosis not present

## 2019-04-20 DIAGNOSIS — Z96651 Presence of right artificial knee joint: Secondary | ICD-10-CM | POA: Diagnosis not present

## 2019-04-20 DIAGNOSIS — G4730 Sleep apnea, unspecified: Secondary | ICD-10-CM | POA: Diagnosis not present

## 2019-04-20 DIAGNOSIS — Z471 Aftercare following joint replacement surgery: Secondary | ICD-10-CM | POA: Diagnosis not present

## 2019-04-22 DIAGNOSIS — G4730 Sleep apnea, unspecified: Secondary | ICD-10-CM | POA: Diagnosis not present

## 2019-04-22 DIAGNOSIS — Z471 Aftercare following joint replacement surgery: Secondary | ICD-10-CM | POA: Diagnosis not present

## 2019-04-22 DIAGNOSIS — Z96651 Presence of right artificial knee joint: Secondary | ICD-10-CM | POA: Diagnosis not present

## 2019-05-04 DIAGNOSIS — Z471 Aftercare following joint replacement surgery: Secondary | ICD-10-CM | POA: Diagnosis not present

## 2019-05-04 DIAGNOSIS — Z96651 Presence of right artificial knee joint: Secondary | ICD-10-CM | POA: Diagnosis not present

## 2019-05-05 DIAGNOSIS — Z96651 Presence of right artificial knee joint: Secondary | ICD-10-CM | POA: Diagnosis not present

## 2019-05-05 DIAGNOSIS — M25561 Pain in right knee: Secondary | ICD-10-CM | POA: Diagnosis not present

## 2019-05-08 DIAGNOSIS — Z96651 Presence of right artificial knee joint: Secondary | ICD-10-CM | POA: Diagnosis not present

## 2019-05-08 DIAGNOSIS — M25561 Pain in right knee: Secondary | ICD-10-CM | POA: Diagnosis not present

## 2019-05-11 DIAGNOSIS — Z96651 Presence of right artificial knee joint: Secondary | ICD-10-CM | POA: Diagnosis not present

## 2019-05-11 DIAGNOSIS — M25561 Pain in right knee: Secondary | ICD-10-CM | POA: Diagnosis not present

## 2019-05-14 DIAGNOSIS — M25561 Pain in right knee: Secondary | ICD-10-CM | POA: Diagnosis not present

## 2019-05-14 DIAGNOSIS — Z96651 Presence of right artificial knee joint: Secondary | ICD-10-CM | POA: Diagnosis not present

## 2019-05-15 DIAGNOSIS — Z96651 Presence of right artificial knee joint: Secondary | ICD-10-CM | POA: Diagnosis not present

## 2019-05-15 DIAGNOSIS — M25561 Pain in right knee: Secondary | ICD-10-CM | POA: Diagnosis not present

## 2019-05-18 DIAGNOSIS — Z96651 Presence of right artificial knee joint: Secondary | ICD-10-CM | POA: Diagnosis not present

## 2019-05-18 DIAGNOSIS — M25561 Pain in right knee: Secondary | ICD-10-CM | POA: Diagnosis not present

## 2019-05-20 DIAGNOSIS — Z96651 Presence of right artificial knee joint: Secondary | ICD-10-CM | POA: Diagnosis not present

## 2019-05-20 DIAGNOSIS — M25561 Pain in right knee: Secondary | ICD-10-CM | POA: Diagnosis not present

## 2019-05-22 DIAGNOSIS — M25561 Pain in right knee: Secondary | ICD-10-CM | POA: Diagnosis not present

## 2019-05-22 DIAGNOSIS — Z96651 Presence of right artificial knee joint: Secondary | ICD-10-CM | POA: Diagnosis not present

## 2019-05-25 DIAGNOSIS — Z96651 Presence of right artificial knee joint: Secondary | ICD-10-CM | POA: Diagnosis not present

## 2019-05-25 DIAGNOSIS — M25561 Pain in right knee: Secondary | ICD-10-CM | POA: Diagnosis not present

## 2019-05-29 DIAGNOSIS — M25561 Pain in right knee: Secondary | ICD-10-CM | POA: Diagnosis not present

## 2019-05-29 DIAGNOSIS — Z96651 Presence of right artificial knee joint: Secondary | ICD-10-CM | POA: Diagnosis not present

## 2019-06-01 DIAGNOSIS — Z96651 Presence of right artificial knee joint: Secondary | ICD-10-CM | POA: Diagnosis not present

## 2019-06-01 DIAGNOSIS — M25561 Pain in right knee: Secondary | ICD-10-CM | POA: Diagnosis not present

## 2019-06-03 DIAGNOSIS — Z96651 Presence of right artificial knee joint: Secondary | ICD-10-CM | POA: Diagnosis not present

## 2019-06-03 DIAGNOSIS — M25561 Pain in right knee: Secondary | ICD-10-CM | POA: Diagnosis not present

## 2019-06-08 DIAGNOSIS — M25561 Pain in right knee: Secondary | ICD-10-CM | POA: Diagnosis not present

## 2019-06-08 DIAGNOSIS — Z96651 Presence of right artificial knee joint: Secondary | ICD-10-CM | POA: Diagnosis not present

## 2019-06-11 DIAGNOSIS — Z96651 Presence of right artificial knee joint: Secondary | ICD-10-CM | POA: Diagnosis not present

## 2019-06-11 DIAGNOSIS — M25561 Pain in right knee: Secondary | ICD-10-CM | POA: Diagnosis not present

## 2019-06-15 DIAGNOSIS — M25561 Pain in right knee: Secondary | ICD-10-CM | POA: Diagnosis not present

## 2019-06-15 DIAGNOSIS — Z96651 Presence of right artificial knee joint: Secondary | ICD-10-CM | POA: Diagnosis not present

## 2019-06-17 DIAGNOSIS — M1711 Unilateral primary osteoarthritis, right knee: Secondary | ICD-10-CM | POA: Diagnosis not present

## 2019-06-22 DIAGNOSIS — M25561 Pain in right knee: Secondary | ICD-10-CM | POA: Diagnosis not present

## 2019-06-22 DIAGNOSIS — Z96651 Presence of right artificial knee joint: Secondary | ICD-10-CM | POA: Diagnosis not present

## 2019-06-25 DIAGNOSIS — M25561 Pain in right knee: Secondary | ICD-10-CM | POA: Diagnosis not present

## 2019-06-25 DIAGNOSIS — Z96651 Presence of right artificial knee joint: Secondary | ICD-10-CM | POA: Diagnosis not present

## 2019-07-02 DIAGNOSIS — Z96651 Presence of right artificial knee joint: Secondary | ICD-10-CM | POA: Diagnosis not present

## 2019-07-02 DIAGNOSIS — M25561 Pain in right knee: Secondary | ICD-10-CM | POA: Diagnosis not present

## 2019-07-09 DIAGNOSIS — Z96651 Presence of right artificial knee joint: Secondary | ICD-10-CM | POA: Diagnosis not present

## 2019-07-09 DIAGNOSIS — M25561 Pain in right knee: Secondary | ICD-10-CM | POA: Diagnosis not present

## 2019-07-16 DIAGNOSIS — Z96651 Presence of right artificial knee joint: Secondary | ICD-10-CM | POA: Diagnosis not present

## 2019-07-16 DIAGNOSIS — M25561 Pain in right knee: Secondary | ICD-10-CM | POA: Diagnosis not present

## 2019-07-23 DIAGNOSIS — M25561 Pain in right knee: Secondary | ICD-10-CM | POA: Diagnosis not present

## 2019-07-23 DIAGNOSIS — Z96651 Presence of right artificial knee joint: Secondary | ICD-10-CM | POA: Diagnosis not present

## 2019-10-08 DIAGNOSIS — H2513 Age-related nuclear cataract, bilateral: Secondary | ICD-10-CM | POA: Diagnosis not present

## 2019-10-12 ENCOUNTER — Encounter: Payer: Self-pay | Admitting: Family Medicine

## 2019-10-12 ENCOUNTER — Ambulatory Visit (INDEPENDENT_AMBULATORY_CARE_PROVIDER_SITE_OTHER): Payer: Medicare Other | Admitting: Family Medicine

## 2019-10-12 ENCOUNTER — Other Ambulatory Visit: Payer: Self-pay | Admitting: Family Medicine

## 2019-10-12 VITALS — Ht 62.0 in

## 2019-10-12 DIAGNOSIS — R05 Cough: Secondary | ICD-10-CM

## 2019-10-12 DIAGNOSIS — J301 Allergic rhinitis due to pollen: Secondary | ICD-10-CM

## 2019-10-12 DIAGNOSIS — K56609 Unspecified intestinal obstruction, unspecified as to partial versus complete obstruction: Secondary | ICD-10-CM | POA: Insufficient documentation

## 2019-10-12 DIAGNOSIS — R059 Cough, unspecified: Secondary | ICD-10-CM

## 2019-10-12 MED ORDER — FLUTICASONE PROPIONATE 50 MCG/ACT NA SUSP: 2.0000 | Freq: Every day | NASAL | 0 refills | Status: DC

## 2019-10-12 MED ORDER — BENZONATATE 100 MG PO CAPS: 100.0000 mg | ORAL_CAPSULE | Freq: Two times a day (BID) | ORAL | 0 refills | Status: DC | PRN

## 2019-10-12 MED ORDER — MONTELUKAST SODIUM 10 MG PO TABS: 10.0000 mg | ORAL_TABLET | Freq: Every day | ORAL | 0 refills | Status: DC

## 2019-10-12 NOTE — Progress Notes (Signed)
Virtual telephone visit    Virtual Visit via Telephone Note   This visit type was conducted due to national recommendations for restrictions regarding the COVID-19 Pandemic (e.g. social distancing) in an effort to limit this patient's exposure and mitigate transmission in our community. Due to her co-morbid illnesses, this patient is at least at moderate risk for complications without adequate follow up. This format is felt to be most appropriate for this patient at this time. The patient did not have access to video technology or had technical difficulties with video requiring transitioning to audio format only (telephone). Physical exam was limited to content and character of the telephone converstion.    Patient location: home  Provider location: [facility]   Visit Date: 10/12/2019  Today's healthcare provider: Lelon Huh, MD   Chief Complaint  Patient presents with  . Cough  . Sore Throat   Mertie Moores as a scribe for Lelon Huh, MD.,have documented all relevant documentation on the behalf of Lelon Huh, MD,as directed by  Lelon Huh, MD while in the presence of Lelon Huh, MD.  Subjective    Cough This is a new problem. Episode onset: 10 days ago. The problem has been waxing and waning. The problem occurs every few minutes. Associated symptoms include a sore throat. Treatments tried: OTC cold medication and hot fluids. The treatment provided mild relief.  Now starting to have pain in both ears. Also having congestion and pressure in sinuses.  Having some clear drainage. Not on any allergy medications. No fevers or chills or sweats. No shortness of breath.    Medications: Outpatient Medications Prior to Visit  Medication Sig  . Ascorbic Acid (VITAMIN C) 100 MG tablet Take 100 mg by mouth daily.  Marland Kitchen ibuprofen (ADVIL,MOTRIN) 200 MG tablet Take 200 mg by mouth every 6 (six) hours as needed.  . Multiple Vitamin (MULTIVITAMIN) capsule Take 1 capsule by  mouth daily.   . traMADol (ULTRAM) 50 MG tablet Take 50 mg by mouth daily.   No facility-administered medications prior to visit.    Review of Systems  HENT: Positive for sore throat.   Respiratory: Positive for cough.      Objective    Ht 5\' 2"  (1.575 m)   BMI 37.13 kg/m   Awake, alert, oriented x 3. In no apparent distress   Assessment & Plan     1. Cough  - montelukast (SINGULAIR) 10 MG tablet; Take 1 tablet (10 mg total) by mouth at bedtime.  Dispense: 30 tablet; Refill: 0  2. Seasonal allergic rhinitis due to pollen  - benzonatate (TESSALON) 100 MG capsule; Take 1 capsule (100 mg total) by mouth 2 (two) times daily as needed for cough.  Dispense: 20 capsule; Refill: 0 - fluticasone (FLONASE) 50 MCG/ACT nasal spray; Place 2 sprays into both nostrils daily.  Dispense: 16 g; Refill: 0  Call if symptoms change or if not rapidly improving.     No follow-ups on file.    I discussed the assessment and treatment plan with the patient. The patient was provided an opportunity to ask questions and all were answered. The patient agreed with the plan and demonstrated an understanding of the instructions.   The patient was advised to call back or seek an in-person evaluation if the symptoms worsen or if the condition fails to improve as anticipated.  I provided 8 minutes of non-face-to-face time during this encounter.  The entirety of the information documented in the History of Present Illness, Review of  Systems and Physical Exam were personally obtained by me. Portions of this information were initially documented by the CMA and reviewed by me for thoroughness and accuracy.     Lelon Huh, MD [facility] [phone] (phone) [phone] (fax)  [place]

## 2019-10-12 NOTE — Telephone Encounter (Signed)
Requested medication (s) are due for refill today:  No  Requested medication (s) are on the active medication list:  Yes  Future visit scheduled:  No  Last Refill: today  Note to Clinic:  Pharmacy sent Rx and reported pt. Is requesting a 90 day supply; please advise  Requested Prescriptions  Pending Prescriptions Disp Refills   montelukast (SINGULAIR) 10 MG tablet [Pharmacy Med Name: MONTELUKAST 10MG  TABLETS] 90 tablet     Sig: TAKE 1 TABLET(10 MG) BY MOUTH AT BEDTIME      Pulmonology:  Leukotriene Inhibitors Passed - 10/12/2019 11:00 AM      Passed - Valid encounter within last 12 months    Recent Outpatient Visits           Today Cough   [facility] Birdie Sons, MD   9 months ago Pre-op exam   [facility] Birdie Sons, MD   1 year ago Skin lesion   [facility] Birdie Sons, MD   2 years ago Acute frontal sinusitis, recurrence not specified   [facility] Birdie Sons, MD   3 years ago Primary osteoarthritis of both [place], [place], PA-C                fluticasone [facility]) 50 MCG/ACT nasal spray [Pharmacy Med Name: FLUTICASONE 50MCG NASAL SP (120) RX] 48 g     Sig: SHAKE LIQUID AND USE 2 SPRAYS IN EACH NOSTRIL DAILY      Ear, Nose, and Throat: Nasal Preparations - Corticosteroids Passed - 10/12/2019 11:00 AM      Passed - Valid encounter within last 12 months    Recent Outpatient Visits           Today Cough   [facility] Birdie Sons, MD   9 months ago Pre-op exam   [facility] Birdie Sons, MD   1 year ago Skin lesion   [facility] Birdie Sons, MD   2 years ago Acute frontal sinusitis, recurrence not specified   [facility] Birdie Sons, MD   3 years ago Primary osteoarthritis of both [place], [place], [place]

## 2019-10-19 ENCOUNTER — Ambulatory Visit: Payer: Self-pay | Admitting: *Deleted

## 2019-10-19 MED ORDER — DOXYCYCLINE HYCLATE 100 MG PO TABS: 100.0000 mg | ORAL_TABLET | Freq: Two times a day (BID) | ORAL | 0 refills | Status: DC

## 2019-10-19 NOTE — Addendum Note (Signed)
Addended by: Birdie Sons on: 10/19/2019 04:27 PM   Modules accepted: Orders

## 2019-10-19 NOTE — Telephone Encounter (Signed)
Patient advised.

## 2019-10-19 NOTE — Telephone Encounter (Signed)
Have sent prescription for doxycycline to her pharmacy.

## 2019-10-19 NOTE — Telephone Encounter (Signed)
Pt called in c/o not being much better.   She saw Dr. Caryn Section on 10/12/2019 with c/o sore throat, nasal congestion and coughing. He prescribed Tessalon 100 mg Flonase nasal spray 50 mcg Singulair 10 mg nightly  She continues to have the cough especially at night.   It's hard to sleep.  Her nose continues to be congested and now she is getting a little blood ting when she blows her nose (which she said she is doing a lot).  The highest her temperature has been is 99.5.    She said she is drinking hot and cold tea along with using honey for the cough.   The honey helps some. She's wanting to know if there is something else Dr. Caryn Section can prescribe for her. I let her know I would send him a note letting him know about you symptoms. Someone from the office will call you after checking with Dr. Caryn Section for his recommendations.  She was agreeable to this plan.  I sent my notes to [facility] for Dr. Caryn Section high priority.    Reason for Disposition . [1] Caller has URGENT medicine question about med that PCP or specialist prescribed AND [2] triager unable to answer question    Still coughing a lot, nasal congestion and sore throat.  Answer Assessment - Initial Assessment Questions 1. NAME of MEDICATION: "What medicine are you calling about?"     Benzonatate cap take twice a day. Moneukast at bedtime Fluticasone nasal spray use once a day.  My throat is still sore and coughing a lot at night.  I drink hot tea and using honey.  I'm hoarse and congested.    When I blow my nose I have a little blood on the tissue. 2. QUESTION: "What is your question?" (e.g., medication refill, side effect)     10/12/2019 these medications were prescribed. 3. PRESCRIBING HCP: "Who prescribed it?" Reason: if prescribed by specialist, call should be referred to that group.     Dr. Caryn Section 4. SYMPTOMS: "Do you have any symptoms?"     Coughing, sore throat, runny nose with a little blood.  99.5 fever is the  highest.   5. SEVERITY: If symptoms are present, ask "Are they mild, moderate or severe?"     My symptoms have improved some but I'm still not doing well. 6. PREGNANCY:  "Is there any chance that you are pregnant?" "When was your last menstrual period?"     N/A due to age  Protocols used: MEDICATION QUESTION CALL-A-AH

## 2019-10-30 DIAGNOSIS — M19012 Primary osteoarthritis, left shoulder: Secondary | ICD-10-CM | POA: Diagnosis not present

## 2020-01-27 ENCOUNTER — Other Ambulatory Visit: Payer: Self-pay | Admitting: Family Medicine

## 2020-01-27 DIAGNOSIS — Z1231 Encounter for screening mammogram for malignant neoplasm of breast: Secondary | ICD-10-CM

## 2020-02-18 ENCOUNTER — Other Ambulatory Visit: Payer: Self-pay

## 2020-02-18 ENCOUNTER — Ambulatory Visit
Admission: RE | Admit: 2020-02-18 | Discharge: 2020-02-18 | Disposition: A | Discharge status: Home / Self Care | Payer: Medicare Other | Source: Ambulatory Visit | Attending: Family Medicine | Admitting: Family Medicine

## 2020-02-18 DIAGNOSIS — Z1231 Encounter for screening mammogram for malignant neoplasm of breast: Secondary | ICD-10-CM | POA: Insufficient documentation

## 2020-06-13 ENCOUNTER — Ambulatory Visit: Payer: Self-pay | Admitting: Family Medicine

## 2020-06-13 DIAGNOSIS — U071 COVID-19: Secondary | ICD-10-CM

## 2020-06-13 NOTE — Telephone Encounter (Signed)
C/o covid symptoms started yesterday with body aches, cough and headache. Took at home covid test and it was positive. Denies chest pain, difficulty breathing, fever, sore throat. Reviewed CDC guidelines. Criteria for self-isolation if you test positive for COVID-19, regardless of vaccination status:  -If you have mild symptoms that are resolving or have resolved, isolate at home for 5 days since symptoms started AND continue to wear a well-fitted mask when around others in the home and in public for 5 additional days after isolation is completed -If you have a fever and/or moderate to severe symptoms, isolate for at least 10 days since the symptoms started AND until you are fever free for at least 24 hours without the use of fever-reducing medications -If you tested positive and did not have symptoms, isolate for at least 5 days after your positive test  Use over-the-counter medications for symptoms.If you develop respiratory issues/distress, seek medical care in the Emergency Department.  If you must leave home or if you have to be around others please wear a mask. Please limit contact with immediate family members in the home, practice social distancing, frequent handwashing and clean hard surfaces touched frequently with household cleaning products. Members of your household will also need to quarantine and test.  Patient given care advise. Patient verbalized understanding of care advise and to call back and to go to ED if symptoms worsen. Patient requesting if she should be referred to [facility] infusion clinic. Denies any hx of co morbidities.   Reason for Disposition . [1] [id number] diagnosed by positive lab test (e.g., PCR, rapid self-test kit) AND [2] mild symptoms (e.g., cough, fever, others) AND [2] no complications or SOB  Answer Assessment - Initial Assessment Questions 1. COVID-19 DIAGNOSIS: "Who made your COVID-19 diagnosis?" "Was it confirmed by a positive lab test?" If not diagnosed by a HCP,  ask "Are there lots of cases (community spread) where you live?" Note: See public health department website, if unsure.     At home test  2. COVID-19 EXPOSURE: "Was there any known exposure to COVID before the symptoms began?" CDC Definition of close contact: within 6 feet (2 meters) for a total of 15 minutes or more over a 24-hour period.      No  3. ONSET: "When did the COVID-19 symptoms start?"      Yesterday  4. WORST SYMPTOM: "What is your worst symptom?" (e.g., cough, fever, shortness of breath, muscle aches)     Body aches , stuffiness in head , headaches  5. COUGH: "Do you have a cough?" If Yes, ask: "How bad is the cough?"       Yes nonproductive  6. FEVER: "Do you have a fever?" If Yes, ask: "What is your temperature, how was it measured, and when did it start?"     na 7. RESPIRATORY STATUS: "Describe your breathing?" (e.g., shortness of breath, wheezing, unable to speak)      no 8. BETTER-SAME-WORSE: "Are you getting better, staying the same or getting worse compared to yesterday?"  If getting worse, ask, "In what way?"     Better  9. HIGH RISK DISEASE: "Do you have any chronic medical problems?" (e.g., asthma, heart or lung disease, weak immune system, obesity, etc.)     na 10. VACCINE: "Have you gotten the COVID-19 vaccine?" If Yes ask: "Which one, how many shots, when did you get it?"       Pfizer 1st and 2nd dose. No booster  11. PREGNANCY: "Is there any  chance you are pregnant?" "When was your last menstrual period?"       Na  12. OTHER SYMPTOMS: "Do you have any other symptoms?"  (e.g., chills, fatigue, headache, loss of smell or taste, muscle pain, sore throat; new loss of smell or taste especially support the diagnosis of COVID-19)       Cough, headache, body aches  Protocols used: CORONAVIRUS (COVID-19) DIAGNOSED OR SUSPECTED-A-AH

## 2020-06-14 NOTE — Telephone Encounter (Signed)
Yes recommend referral for covid treatment

## 2020-06-14 NOTE — Telephone Encounter (Signed)
Please review triage nurse telephone encounter below. Patient requesting if she should be referred to [facility] infusion clinic. Denies any hx of co morbidities.

## 2020-06-15 NOTE — Telephone Encounter (Signed)
Patient advised. Referral order placed.

## 2020-06-16 ENCOUNTER — Telehealth: Payer: Self-pay | Admitting: Unknown Physician Specialty

## 2020-06-16 ENCOUNTER — Telehealth: Payer: Self-pay | Admitting: Physician Assistant

## 2020-06-16 NOTE — Telephone Encounter (Signed)
Called to discuss with patient about COVID-19 symptoms and the use of one of the available treatments for those with mild to moderate Covid symptoms and at a high risk of hospitalization.  Pt appears to qualify for outpatient treatment due to co-morbid conditions and/or a member of an at-risk group in accordance with the FDA Emergency Use Authorization.     Unable to reach pt - LMOM  2nd call.  Gave number for hotline  Kathrine Haddock

## 2020-06-16 NOTE — Telephone Encounter (Signed)
Called to discuss with patient about COVID-19 symptoms and the use of one of the available treatments for those with mild to moderate Covid symptoms and at a high risk of hospitalization.  Pt appears to qualify for outpatient treatment due to co-morbid conditions and/or a member of an at-risk group in accordance with the FDA Emergency Use Authorization.    Symptom onset: 2/13 Vaccinated: yes per referral info Booster? unknown Immunocompromised? no Qualifiers: age, BMI.   Unable to reach pt - left VM and mychart message.   Olivia [name]

## 2020-06-17 ENCOUNTER — Telehealth: Payer: Self-pay | Admitting: Adult Health

## 2020-06-17 NOTE — Telephone Encounter (Signed)
Called to discuss with patient about COVID-19 symptoms and the use of one of the available treatments for those with mild to moderate Covid symptoms and at a high risk of hospitalization.  Pt appears to qualify for outpatient treatment due to co-morbid conditions and/or a member of an at-risk group in accordance with the FDA Emergency Use Authorization.    Symptom onset: 2/13 Vaccinated: x 2  Booster? No  Immunocompromised? No  Qualifiers: yes   Unable to reach pt - contacted patient , feeling much better, declines MAB .   Khayree Delellis NP-C

## 2020-06-21 ENCOUNTER — Telehealth: Payer: Self-pay

## 2020-06-21 NOTE — Telephone Encounter (Signed)
Copied from [place] [id number]. Topic: General - Other >> Jun 21, 2020  2:41 PM Leward Quan A wrote: Reason for CRM: Patient scheduled for a virtual visit on 06/22/20 need directions on how to log in for this visit please call Ph# [phone]

## 2020-06-21 NOTE — Telephone Encounter (Signed)
Copied from [place] [id number]. Topic: General - Other >> Jun 20, 2020  4:28 PM Wynetta Emery, [place] C wrote: Reason for CRM: pt called in to request a Rx. Pt says that she is on her 10th day of having covid. Pt says that she is currently experiencing a cough, mucus/congestion.    Pharmacy: [facility] [place], [place] AT [facility] OF [address]  Phone:  [phone] Fax:  [phone]    [phone]--

## 2020-06-22 ENCOUNTER — Telehealth (INDEPENDENT_AMBULATORY_CARE_PROVIDER_SITE_OTHER): Payer: Medicare Other | Admitting: Physician Assistant

## 2020-06-22 DIAGNOSIS — U071 COVID-19: Secondary | ICD-10-CM

## 2020-06-22 DIAGNOSIS — J0110 Acute frontal sinusitis, unspecified: Secondary | ICD-10-CM

## 2020-06-22 MED ORDER — DOXYCYCLINE HYCLATE 100 MG PO TABS: 100.0000 mg | ORAL_TABLET | Freq: Two times a day (BID) | ORAL | 0 refills | Status: AC

## 2020-06-22 NOTE — Patient Instructions (Signed)

## 2020-06-22 NOTE — Progress Notes (Signed)
MyChart Video Visit    Virtual Visit via Video Note   This visit type was conducted due to national recommendations for restrictions regarding the COVID-19 Pandemic (e.g. social distancing) in an effort to limit this patient's exposure and mitigate transmission in our community. This patient is at least at moderate risk for complications without adequate follow up. This format is felt to be most appropriate for this patient at this time. Physical exam was limited by quality of the video and audio technology used for the visit.   Patient location: home Provider location: office   I discussed the limitations of evaluation and management by telemedicine and the availability of in person appointments. The patient expressed understanding and agreed to proceed.  Patient: Olivia [name]   DOB: 1945-08-24   75 y.o. Female  MRN: [id number] Visit Date: 06/22/2020  Today's healthcare provider: Trinna Post, PA-C   Chief Complaint  Patient presents with  . Covid Positive  . Cough  I,Breauna Mazzeo M Ravi Tuccillo,acting as a scribe for Trinna Post, PA-C.,have documented all relevant documentation on the behalf of Trinna Post, PA-C,as directed by  Trinna Post, PA-C while in the presence of Trinna Post, PA-C.  Subjective    Cough This is a recurrent problem. The current episode started 1 to 4 weeks ago. The problem has been gradually improving. The problem occurs constantly. The cough is productive of sputum. Associated symptoms include nasal congestion, postnasal drip, rhinorrhea and a sore throat. Pertinent negatives include no chills, ear pain, fever, headaches, shortness of breath or wheezing. Treatments tried: mucinex.  Tested positive for covid on 06/12/20. Has been having worsening facial pain or pressure. She has not taken anything besides mucinex.      Medications: Outpatient Medications Prior to Visit  Medication Sig  . Ascorbic Acid (VITAMIN C) 100 MG tablet Take  100 mg by mouth daily.  . fluticasone (FLONASE) 50 MCG/ACT nasal spray Place 2 sprays into both nostrils daily.  Marland Kitchen ibuprofen (ADVIL,MOTRIN) 200 MG tablet Take 200 mg by mouth every 6 (six) hours as needed.  . montelukast (SINGULAIR) 10 MG tablet Take 1 tablet (10 mg total) by mouth at bedtime.  . Multiple Vitamin (MULTIVITAMIN) capsule Take 1 capsule by mouth daily.   . traMADol (ULTRAM) 50 MG tablet Take 50 mg by mouth daily.  . benzonatate (TESSALON) 100 MG capsule Take 1 capsule (100 mg total) by mouth 2 (two) times daily as needed for cough. (Patient not taking: Reported on 06/22/2020)  . [DISCONTINUED] doxycycline (VIBRA-TABS) 100 MG tablet Take 1 tablet (100 mg total) by mouth 2 (two) times daily. (Patient not taking: Reported on 06/22/2020)   No facility-administered medications prior to visit.    Review of Systems  Constitutional: Negative for appetite change, chills, fatigue and fever.  HENT: Positive for congestion, postnasal drip, rhinorrhea, sinus pressure, sinus pain and sore throat. Negative for ear pain and sneezing.   Respiratory: Positive for cough. Negative for chest tightness, shortness of breath and wheezing.   Gastrointestinal: Negative for diarrhea, nausea and vomiting.  Neurological: Negative for weakness and headaches.      Objective    There were no vitals taken for this visit.   Physical Exam Constitutional:      Appearance: Normal appearance.  Pulmonary:     Effort: Pulmonary effort is normal. No respiratory distress.  Neurological:     Mental Status: She is alert.  Psychiatric:        Mood and Affect:  Mood normal.        Behavior: Behavior normal.        Assessment & Plan    1. COVID-19   2. Acute non-recurrent frontal sinusitis  - doxycycline (VIBRA-TABS) 100 MG tablet; Take 1 tablet (100 mg total) by mouth 2 (two) times daily for 7 days.  Dispense: 14 tablet; Refill: 0   Return if symptoms worsen or fail to improve.     I discussed the  assessment and treatment plan with the patient. The patient was provided an opportunity to ask questions and all were answered. The patient agreed with the plan and demonstrated an understanding of the instructions.   The patient was advised to call back or seek an in-person evaluation if the symptoms worsen or if the condition fails to improve as anticipated.   The entirety of the information documented in the History of Present Illness, Review of Systems and Physical Exam were personally obtained by me. Portions of this information were initially documented by [facility] and reviewed by me for thoroughness and accuracy.   The entirety of the information documented in the History of Present Illness, Review of Systems and Physical Exam were personally obtained by me. Portions of this information were initially documented by [facility] and reviewed by me for thoroughness and accuracy.    Paulene Floor [facility] [phone] (phone) [phone] (fax)  [place]

## 2020-06-22 NOTE — Telephone Encounter (Signed)
Patient had virtual visit today with Carles Collet, PA-C.

## 2020-06-22 NOTE — Telephone Encounter (Signed)
Patient had mychart visit. 

## 2020-10-26 DIAGNOSIS — H2513 Age-related nuclear cataract, bilateral: Secondary | ICD-10-CM | POA: Diagnosis not present

## 2021-04-05 ENCOUNTER — Ambulatory Visit: Payer: Self-pay | Admitting: *Deleted

## 2021-04-05 NOTE — Telephone Encounter (Signed)
Pt called in stating that she has been coughing for 1-2 weeks now. Went to [facility] for Thanksgiving at daughter's home and the weekend after she started feeling bad and coughing and had fever. Her daughter ended up sick with same symptoms as well as some friends she went out to eat with so she is curious if things aren't getting better or if she's contagious. Pt states she feels better now and was able to come home but still has bad cough to where she has mucus but unable to cough it up. Attempted to schedule appt for 04/07/21 but pt asking for appt sooner. Called and spoke with [facility], CMA who was able to schedule pt appt tomorrow at [address] with [place], [place]. Pt notified and Care advice given and pt verbalized understanding. No other questions/concerns noted.    Reason for Disposition  Cough has been present for > 3 weeks  Answer Assessment - Initial Assessment Questions 1. ONSET: "When did the nasal discharge start?"      1-2 weeks  2. AMOUNT: "How much discharge is there?"      Yes 3. COUGH: "Do you have a cough?" If yes, ask: "Describe the color of your sputum" (clear, white, yellow, green)     Yes, mucus in throat that cant cough up 4. RESPIRATORY DISTRESS: "Describe your breathing."      No 5. FEVER: "Do you have a fever?" If Yes, ask: "What is your temperature, how was it measured, and when did it start?"     No 6. SEVERITY: "Overall, how bad are you feeling right now?" (e.g., doesn't interfere with normal activities, staying home from school/work, staying in bed)      Tired and fatigued 7. OTHER SYMPTOMS: "Do you have any other symptoms?" (e.g., sore throat, earache, wheezing, vomiting)     Weak, feeling bad 8. PREGNANCY: "Is there any chance you are pregnant?" "When was your last menstrual period?"     No  Protocols used: Common Cold-A-AH, Cough - Acute Productive-A-AH

## 2021-04-05 NOTE — Telephone Encounter (Signed)
Patient called in, has cough, and said is unable to get phlegm out. She is asking for something to be sent ot pharmacy to help. [facility] [place], [place] AT [facility] OF [address] [facility]  Phone: [phone]  Fax: [phone]   Attempted to call patient- left message to call back.

## 2021-04-06 ENCOUNTER — Telehealth (INDEPENDENT_AMBULATORY_CARE_PROVIDER_SITE_OTHER): Payer: Medicare Other | Admitting: Physician Assistant

## 2021-04-06 ENCOUNTER — Encounter: Payer: Self-pay | Admitting: Physician Assistant

## 2021-04-06 VITALS — Ht 62.0 in

## 2021-04-06 DIAGNOSIS — R0981 Nasal congestion: Secondary | ICD-10-CM

## 2021-04-06 MED ORDER — AZELASTINE-FLUTICASONE 137-50 MCG/ACT NA SUSP: 1.0000 | Freq: Two times a day (BID) | NASAL | 2 refills | Status: DC

## 2021-04-06 NOTE — Progress Notes (Signed)
Date:  04/06/2021   Name:  Olivia [name]   DOB:  1945-07-27   MRN:  [id number]  VIDEO VISIT, TELEHEALTH Virtual Visit via Video Note  I connected with Olivia [name] on 04/06/21 at 10:00 AM EST by a video enabled telemedicine application and verified that I am speaking with the correct person using two identifiers.  Location: Patient: home Provider: bfp  Chief Complaint: Cough x 1.5 weeks  Olivia [name] is a 75 y/o female presents today with concerns over persistent cough for the last 1 to 2 weeks.  She states that she had a primary illness about 2 weeks ago with body aches, headaches, malaise, sinus congestion and cough.  The majority of her symptoms have largely improved but her nasal congestion and cough have persisted.  Reports low-grade headaches.  She denies any chest pain or short of breath.  She thinks she wheezes sometimes when she is lying flat.  Denies any chest congestion feels her mucus is coming from her sinuses.      Lab Results  Component Value Date   NA 142 12/31/2018   K 4.4 12/31/2018   CO2 23 12/31/2018   GLUCOSE 81 12/31/2018   BUN 13 12/31/2018   CREATININE 0.81 12/31/2018   CALCIUM 9.4 12/31/2018   GFRNONAA 72 12/31/2018   Lab Results  Component Value Date   CHOL 185 04/07/2014   HDL 50 04/07/2014   LDLCALC 103 04/07/2014   TRIG 160 04/07/2014   Lab Results  Component Value Date   TSH 1.130 02/14/2015   No results found for: HGBA1C Lab Results  Component Value Date   WBC 5.9 12/31/2018   HGB 13.5 12/31/2018   HCT 40.2 12/31/2018   MCV 92 12/31/2018   PLT 247 12/31/2018   Lab Results  Component Value Date   ALT 15 12/31/2018   AST 15 12/31/2018   ALKPHOS 71 12/31/2018   BILITOT 0.3 12/31/2018   No results found for: 25OHVITD2, 25OHVITD3, VD25OH   Review of Systems  Constitutional:  Positive for fatigue. Negative for fever.  HENT:  Positive for congestion, postnasal drip, rhinorrhea and sinus pressure.   Respiratory:   Positive for cough and wheezing.   Neurological:  Positive for headaches.   Patient Active Problem List   Diagnosis Date Noted   Intestinal obstruction ([place]) 10/12/2019   Primary osteoarthritis of right knee 03/06/2019   Fatigue 02/14/2015   BMI 38.0-38.9,adult 02/14/2015   Sleep apnea 02/14/2015   Insomnia 02/14/2015   Arthritis of both knees 12/22/2014   Essential tremor 12/22/2014   Diverticulosis of colon 12/22/2014   Actinic keratoses 12/22/2014   Arthralgia 12/22/2014   Postmenopausal estrogen deficiency 12/22/2014   DJD (degenerative joint disease) 12/22/2014   Diverticulitis 02/20/2013    No Known Allergies  Past Surgical History:  Procedure Laterality Date   ABDOMINAL HYSTERECTOMY  1976   inverted Uterus. No cancer   APPENDECTOMY     CHOLECYSTECTOMY  2003 ?   COLON SURGERY  05/15/13   Sigmoid colectomy for focal diverticulosis/diverticulitis with contained abscess.   COLONOSCOPY  01-09-13   Dr Donnella Sham   FOOT SURGERY Right 2000   KNEE SURGERY Right 2004   arthroscopy of knee; torn meniscus   TOTAL KNEE ARTHROPLASTY Right 03/06/2019   Dr. Lyndel Pleasure, [facility]   UPPER GI ENDOSCOPY  01-09-13   Dr Donnella Sham. Antral biopsy with minimal gastritis    Social History   Tobacco Use   Smoking status: Never   Smokeless tobacco: Never  Substance Use Topics   Alcohol use: Yes    Alcohol/week: 0.0 standard drinks    Comment: occasional use   Drug use: No     Medication list has been reviewed and updated.  Allergies as of 04/06/2021   No Known Allergies      Medication List        Accurate as of April 06, 2021 11:02 AM. If you have any questions, ask your nurse or doctor.          STOP taking these medications    benzonatate 100 MG capsule Commonly known as: TESSALON Stopped by: Mikey Kirschner, PA-C   traMADol 50 MG tablet Commonly known as: ULTRAM Stopped by: Mikey Kirschner, PA-C       TAKE these medications    fluticasone 50 MCG/ACT nasal  spray Commonly known as: FLONASE Place 2 sprays into both nostrils daily.   ibuprofen 200 MG tablet Commonly known as: ADVIL Take 200 mg by mouth every 6 (six) hours as needed.   montelukast 10 MG tablet Commonly known as: SINGULAIR Take 1 tablet (10 mg total) by mouth at bedtime.   multivitamin capsule Take 1 capsule by mouth daily.   TYLENOL PO Take by mouth as needed.   vitamin C 100 MG tablet Take 100 mg by mouth daily.         PHQ 2/9 Scores 10/12/2019 04/13/2016 04/13/2016 02/14/2015  PHQ - 2 Score 0 1 1 2   PHQ- 9 Score - - - 7    No flowsheet data found.  BP Readings from Last 3 Encounters:  12/31/18 112/70  01/22/18 138/79  06/07/17 (!) 142/80    Physical Exam Constitutional:      Appearance: She is obese. She is not ill-appearing.  Pulmonary:     Effort: No respiratory distress.  Neurological:     Mental Status: She is alert and oriented to person, place, and time.  Psychiatric:        Mood and Affect: Mood normal.        Behavior: Behavior normal.    Wt Readings from Last 3 Encounters:  12/31/18 203 lb (92.1 kg)  01/22/18 203 lb (92.1 kg)  06/07/17 222 lb (100.7 kg)    Ht 5\' 2"  (1.575 m)   BMI 37.13 kg/m   Assessment and Plan:  Nasal congestions/post nasal drip post URI Rx azelastine/fluticasone nasal spray.  Advised to use twice a day.  Encouraged saline nasal rinses, rest, increase fluids.  Advised that if she feels short of breath or her wheezing progresses to call the office and come in for an appointment.  I discussed the limitations of evaluation and management by telemedicine and the availability of in person appointments. The patient expressed understanding and agreed to proceed.   Follow Up Instructions:    I discussed the assessment and treatment plan with the patient. The patient was provided an opportunity to ask questions and all were answered. The patient agreed with the plan and demonstrated an understanding of the  instructions.   The patient was advised to call back or seek an in-person evaluation if the symptoms worsen or if the condition fails to improve as anticipated.  I provided 15 minutes of non-face-to-face time during this encounter.  I, Mikey Kirschner, PA-C have reviewed all documentation for this visit. The documentation on  04/06/2021  for the exam, diagnosis, procedures, and orders are all accurate and complete.  Mikey Kirschner, PA-C [facility] [address] [place], [place], [address] Office:  [phone] Fax: [phone]

## 2021-08-23 ENCOUNTER — Telehealth: Payer: Self-pay | Admitting: Family Medicine

## 2021-08-23 NOTE — Telephone Encounter (Signed)
Copied from [place] [phone]. Topic: Medicare AWV ?>> Aug 23, 2021 11:31 AM Cher Nakai R wrote: ?Reason for CRM:  ?Left message for patient to call back and schedule Medicare Annual Wellness Visit (AWV) in office.  ? ?If unable to come into the office for AWV,  please offer to do virtually or by telephone. ? ?Last AWV:  04/13/2016 ? ?Please schedule at anytime with [facility] Health Advisor. ? ?30 minute appointment for Virtual or phone ?45 minute appointment for in office or Initial virtual/phone ? ?Any questions, please contact me at [phone] ?

## 2021-09-18 ENCOUNTER — Other Ambulatory Visit: Payer: Self-pay

## 2021-09-18 DIAGNOSIS — R0981 Nasal congestion: Secondary | ICD-10-CM

## 2021-09-18 DIAGNOSIS — R059 Cough, unspecified: Secondary | ICD-10-CM

## 2021-09-18 NOTE — Telephone Encounter (Signed)
Summary: medication question   Pt called in about her allergies and stated she started taking Zertec and requested to speak with a nurse about taking this with her other medications, please advise.        Pt requesting a refill of Azelatine- Fluticasone and Singulair.  Last RF of Azelastine-Fluticasone: 04/06/21 RF due. Is on active med list  Pt was not aware she already had Singulair on profile.

## 2021-09-20 MED ORDER — MONTELUKAST SODIUM 10 MG PO TABS: 10.0000 mg | ORAL_TABLET | Freq: Every day | ORAL | 2 refills | Status: DC

## 2021-09-20 MED ORDER — AZELASTINE-FLUTICASONE 137-50 MCG/ACT NA SUSP: 1.0000 | Freq: Two times a day (BID) | NASAL | 2 refills | Status: DC

## 2021-09-20 NOTE — Telephone Encounter (Signed)
Please advise on refill request.   Last refill: Azelastine-Fluticasone 04/06/2021,   23g with 2 refills                 Singulair                      10/12/2019    #30 with 0 refills  Last office visit: 04/06/2021 (video visit for nasal congestion)  No future appointments scheduled.

## 2021-09-20 NOTE — Addendum Note (Signed)
Addended by: Randal Buba on: 09/20/2021 04:15 PM   Modules accepted: Orders

## 2021-10-16 ENCOUNTER — Telehealth: Payer: Self-pay | Admitting: Family Medicine

## 2021-10-16 NOTE — Telephone Encounter (Signed)
Left message for patient to call back and schedule Medicare Annual Wellness Visit (AWV) in office.   If unable to come into the office for AWV,  please offer to do virtually or by telephone.  Last AWV: 04/13/2016  Please schedule at anytime with [facility] Health Advisor.  30 minute appointment for Virtual or phone 45 minute appointment for in office or Initial virtual/phone  Any questions, please contact me at [phone]

## 2021-10-19 ENCOUNTER — Ambulatory Visit (INDEPENDENT_AMBULATORY_CARE_PROVIDER_SITE_OTHER): Payer: Medicare Other

## 2021-10-19 DIAGNOSIS — Z0000 Encounter for general adult medical examination without abnormal findings: Secondary | ICD-10-CM | POA: Diagnosis not present

## 2021-10-19 NOTE — Patient Instructions (Signed)
Ms. Olivia [name] , Thank you for taking time to come for your Medicare Wellness Visit. I appreciate your ongoing commitment to your health goals. Please review the following plan we discussed and let me know if I can assist you in the future.   Screening recommendations/referrals: Colonoscopy: aged out Mammogram: aged out Bone Density: 04/07/14 Recommended yearly ophthalmology/optometry visit for glaucoma screening and checkup Recommended yearly dental visit for hygiene and checkup  Vaccinations: Influenza vaccine: n/c Pneumococcal vaccine: 02/10/14 Tdap vaccine: 03/29/10, due if have an injury Shingles vaccine: n/d   Covid-19:says had 2 shots but doesn't have dates  Advanced directives: no  Conditions/risks identified: none  Next appointment: Follow up in one year for your annual wellness visit 10/23/22 @ 11:30am by phone   Preventive Care 51 Years and Older, Female Preventive care refers to lifestyle choices and visits with your health care provider that can promote health and wellness. What does preventive care include? A yearly physical exam. This is also called an annual well check. Dental exams once or twice a year. Routine eye exams. Ask your health care provider how often you should have your eyes checked. Personal lifestyle choices, including: Daily care of your teeth and gums. Regular physical activity. Eating a healthy diet. Avoiding tobacco and drug use. Limiting alcohol use. Practicing safe sex. Taking low-dose aspirin every day. Taking vitamin and mineral supplements as recommended by your health care provider. What happens during an annual well check? The services and screenings done by your health care provider during your annual well check will depend on your age, overall health, lifestyle risk factors, and family history of disease. Counseling  Your health care provider may ask you questions about your: Alcohol use. Tobacco use. Drug use. Emotional  well-being. Home and relationship well-being. Sexual activity. Eating habits. History of falls. Memory and ability to understand (cognition). Work and work Statistician. Reproductive health. Screening  You may have the following tests or measurements: Height, weight, and BMI. Blood pressure. Lipid and cholesterol levels. These may be checked every 5 years, or more frequently if you are over 77 years old. Skin check. Lung cancer screening. You may have this screening every year starting at age 77 if you have a 30-pack-year history of smoking and currently smoke or have quit within the past 15 years. Fecal occult blood test (FOBT) of the stool. You may have this test every year starting at age 87. Flexible sigmoidoscopy or colonoscopy. You may have a sigmoidoscopy every 5 years or a colonoscopy every 10 years starting at age 77. Hepatitis C blood test. Hepatitis B blood test. Sexually transmitted disease (STD) testing. Diabetes screening. This is done by checking your blood sugar (glucose) after you have not eaten for a while (fasting). You may have this done every 1-3 years. Bone density scan. This is done to screen for osteoporosis. You may have this done starting at age 77. Mammogram. This may be done every 1-2 years. Talk to your health care provider about how often you should have regular mammograms. Talk with your health care provider about your test results, treatment options, and if necessary, the need for more tests. Vaccines  Your health care provider may recommend certain vaccines, such as: Influenza vaccine. This is recommended every year. Tetanus, diphtheria, and acellular pertussis (Tdap, Td) vaccine. You may need a Td booster every 10 years. Zoster vaccine. You may need this after age 77. Pneumococcal 13-valent conjugate (PCV13) vaccine. One dose is recommended after age 77. Pneumococcal polysaccharide (PPSV23) vaccine. One  dose is recommended after age 77. Talk to your  health care provider about which screenings and vaccines you need and how often you need them. This information is not intended to replace advice given to you by your health care provider. Make sure you discuss any questions you have with your health care provider. Document Released: 05/13/2015 Document Revised: 01/04/2016 Document Reviewed: 02/15/2015 Elsevier Interactive Patient Education  2017 [place] Prevention in the Home Falls can cause injuries. They can happen to people of all ages. There are many things you can do to make your home safe and to help prevent falls. What can I do on the outside of my home? Regularly fix the edges of walkways and driveways and fix any cracks. Remove anything that might make you trip as you walk through a door, such as a raised step or threshold. Trim any bushes or trees on the path to your home. Use bright outdoor lighting. Clear any walking paths of anything that might make someone trip, such as rocks or tools. Regularly check to see if handrails are loose or broken. Make sure that both sides of any steps have handrails. Any raised decks and porches should have guardrails on the edges. Have any leaves, snow, or ice cleared regularly. Use sand or salt on walking paths during winter. Clean up any spills in your garage right away. This includes oil or grease spills. What can I do in the bathroom? Use night lights. Install grab bars by the toilet and in the tub and shower. Do not use towel bars as grab bars. Use non-skid mats or decals in the tub or shower. If you need to sit down in the shower, use a plastic, non-slip stool. Keep the floor dry. Clean up any water that spills on the floor as soon as it happens. Remove soap buildup in the tub or shower regularly. Attach bath mats securely with double-sided non-slip rug tape. Do not have throw rugs and other things on the floor that can make you trip. What can I do in the bedroom? Use night  lights. Make sure that you have a light by your bed that is easy to reach. Do not use any sheets or blankets that are too big for your bed. They should not hang down onto the floor. Have a firm chair that has side arms. You can use this for support while you get dressed. Do not have throw rugs and other things on the floor that can make you trip. What can I do in the kitchen? Clean up any spills right away. Avoid walking on wet floors. Keep items that you use a lot in easy-to-reach places. If you need to reach something above you, use a strong step stool that has a grab bar. Keep electrical cords out of the way. Do not use floor polish or wax that makes floors slippery. If you must use wax, use non-skid floor wax. Do not have throw rugs and other things on the floor that can make you trip. What can I do with my stairs? Do not leave any items on the stairs. Make sure that there are handrails on both sides of the stairs and use them. Fix handrails that are broken or loose. Make sure that handrails are as long as the stairways. Check any carpeting to make sure that it is firmly attached to the stairs. Fix any carpet that is loose or worn. Avoid having throw rugs at the top or bottom of the  stairs. If you do have throw rugs, attach them to the floor with carpet tape. Make sure that you have a light switch at the top of the stairs and the bottom of the stairs. If you do not have them, ask someone to add them for you. What else can I do to help prevent falls? Wear shoes that: Do not have high heels. Have rubber bottoms. Are comfortable and fit you well. Are closed at the toe. Do not wear sandals. If you use a stepladder: Make sure that it is fully opened. Do not climb a closed stepladder. Make sure that both sides of the stepladder are locked into place. Ask someone to hold it for you, if possible. Clearly mark and make sure that you can see: Any grab bars or handrails. First and last  steps. Where the edge of each step is. Use tools that help you move around (mobility aids) if they are needed. These include: Canes. Walkers. Scooters. Crutches. Turn on the lights when you go into a dark area. Replace any light bulbs as soon as they burn out. Set up your furniture so you have a clear path. Avoid moving your furniture around. If any of your floors are uneven, fix them. If there are any pets around you, be aware of where they are. Review your medicines with your doctor. Some medicines can make you feel dizzy. This can increase your chance of falling. Ask your doctor what other things that you can do to help prevent falls. This information is not intended to replace advice given to you by your health care provider. Make sure you discuss any questions you have with your health care provider. Document Released: 02/10/2009 Document Revised: 09/22/2015 Document Reviewed: 05/21/2014 Elsevier Interactive Patient Education  2017 [employer].

## 2021-12-20 DIAGNOSIS — H2513 Age-related nuclear cataract, bilateral: Secondary | ICD-10-CM | POA: Diagnosis not present

## 2021-12-21 DIAGNOSIS — H2513 Age-related nuclear cataract, bilateral: Secondary | ICD-10-CM | POA: Diagnosis not present

## 2022-01-11 DIAGNOSIS — M19012 Primary osteoarthritis, left shoulder: Secondary | ICD-10-CM | POA: Diagnosis not present

## 2022-01-23 DIAGNOSIS — M25512 Pain in left shoulder: Secondary | ICD-10-CM | POA: Diagnosis not present

## 2022-01-25 DIAGNOSIS — M25512 Pain in left shoulder: Secondary | ICD-10-CM | POA: Diagnosis not present

## 2022-01-29 DIAGNOSIS — M25512 Pain in left shoulder: Secondary | ICD-10-CM | POA: Diagnosis not present

## 2022-01-31 DIAGNOSIS — M25512 Pain in left shoulder: Secondary | ICD-10-CM | POA: Diagnosis not present

## 2022-02-14 DIAGNOSIS — M25512 Pain in left shoulder: Secondary | ICD-10-CM | POA: Diagnosis not present

## 2022-02-19 IMAGING — MG DIGITAL SCREENING BILAT W/ TOMO W/ CAD
6 of 10 series · 6 of 30 positions shown · non-contrast
Comparison: Previous exam(s).

CLINICAL DATA: Screening.

EXAM:
DIGITAL SCREENING BILATERAL MAMMOGRAM WITH TOMO AND CAD

[L CC synth-2D]
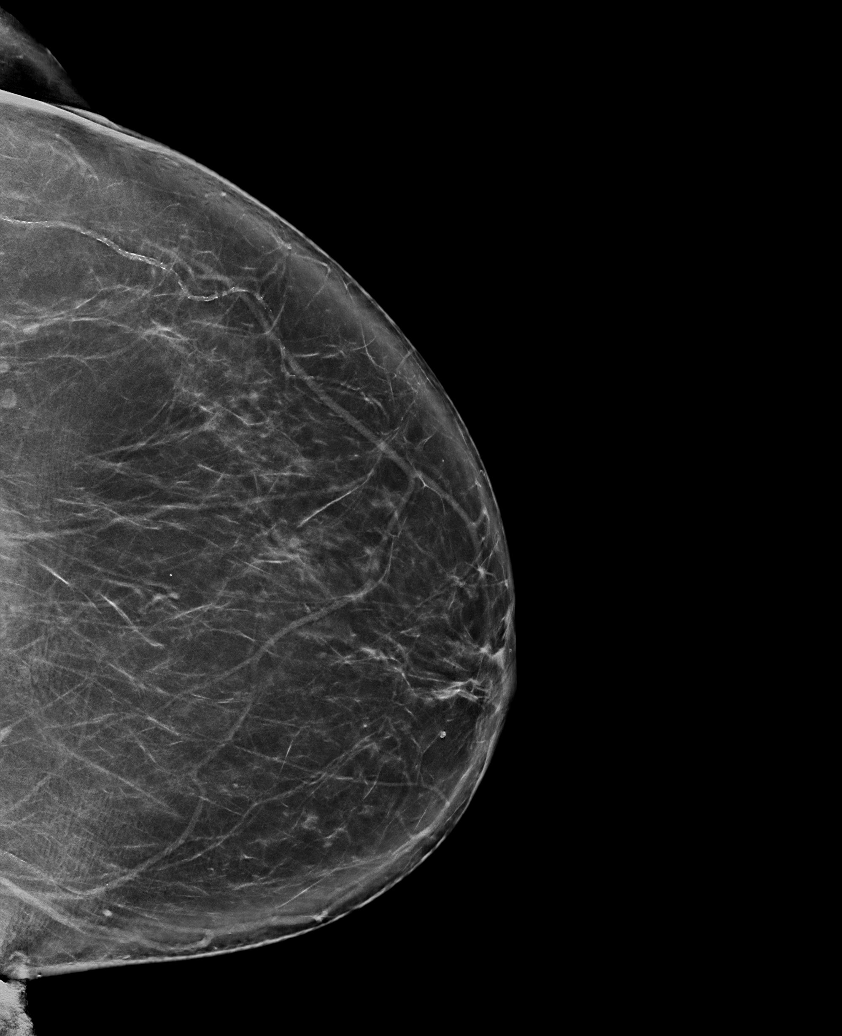

[L MLO synth-2D (1 of 2)]
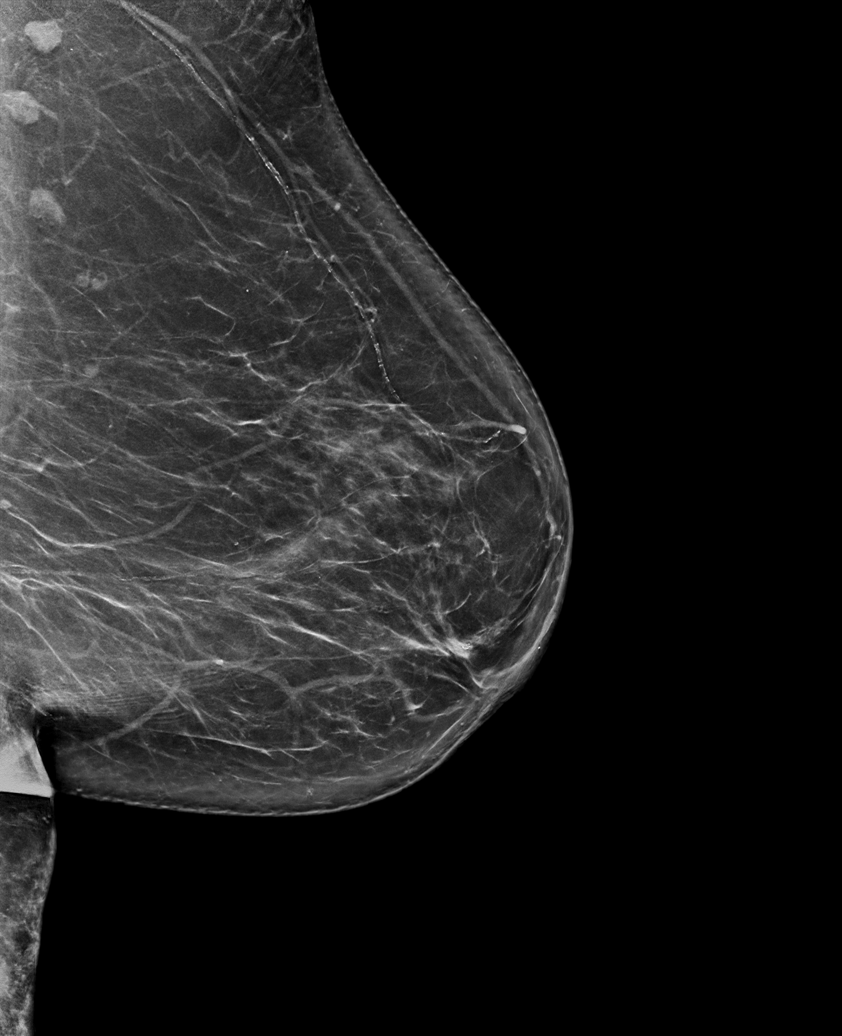

[R MLO synth-2D]
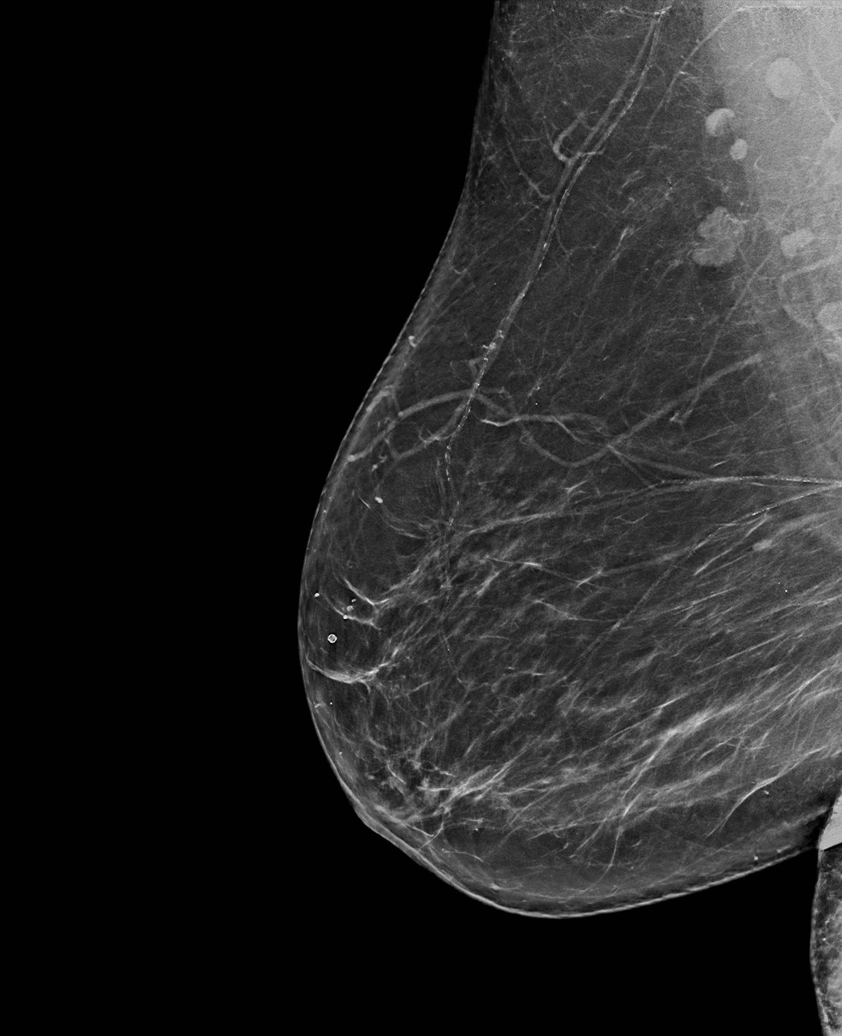

[R CC synth-2D]
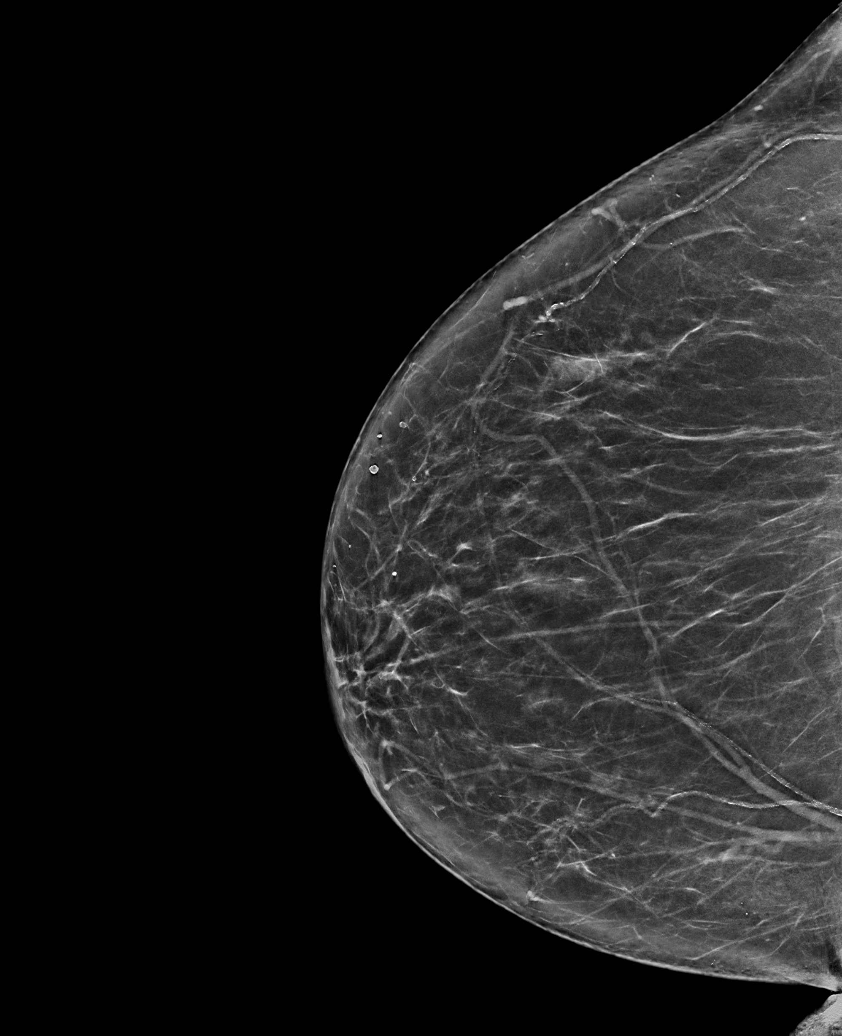

[L MLO synth-2D (2 of 2)]
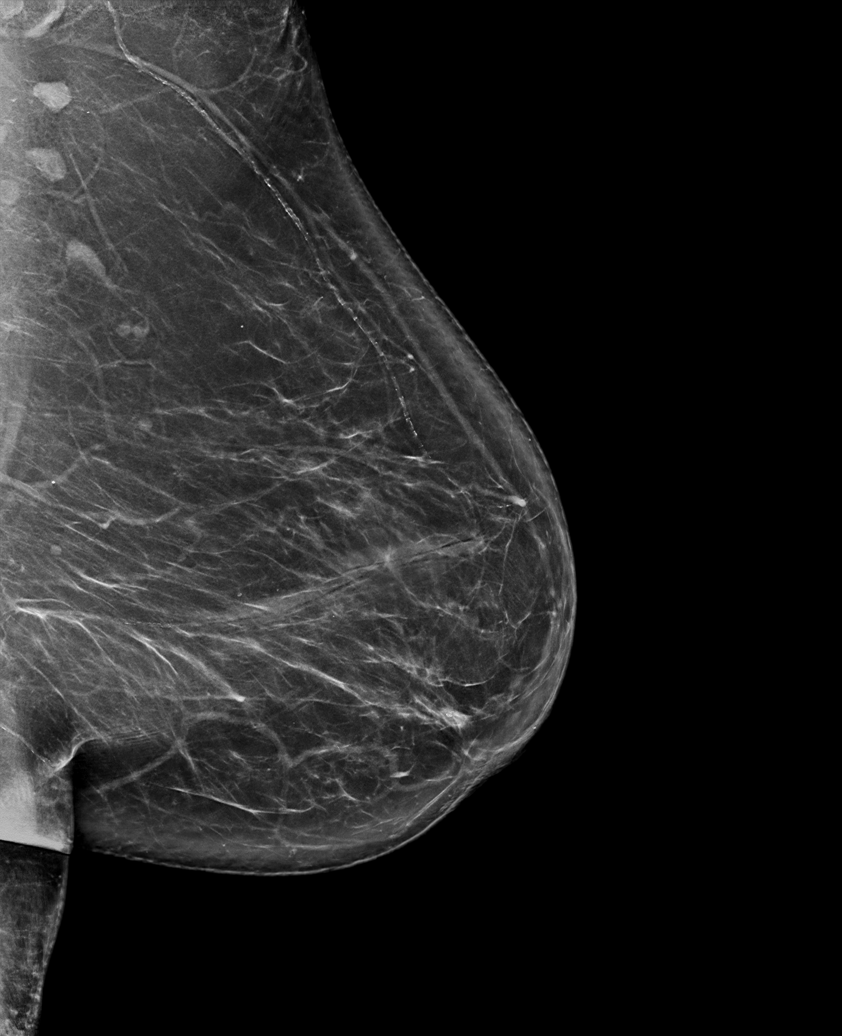

[L MLO tomo · tomo slice 45/89.0]
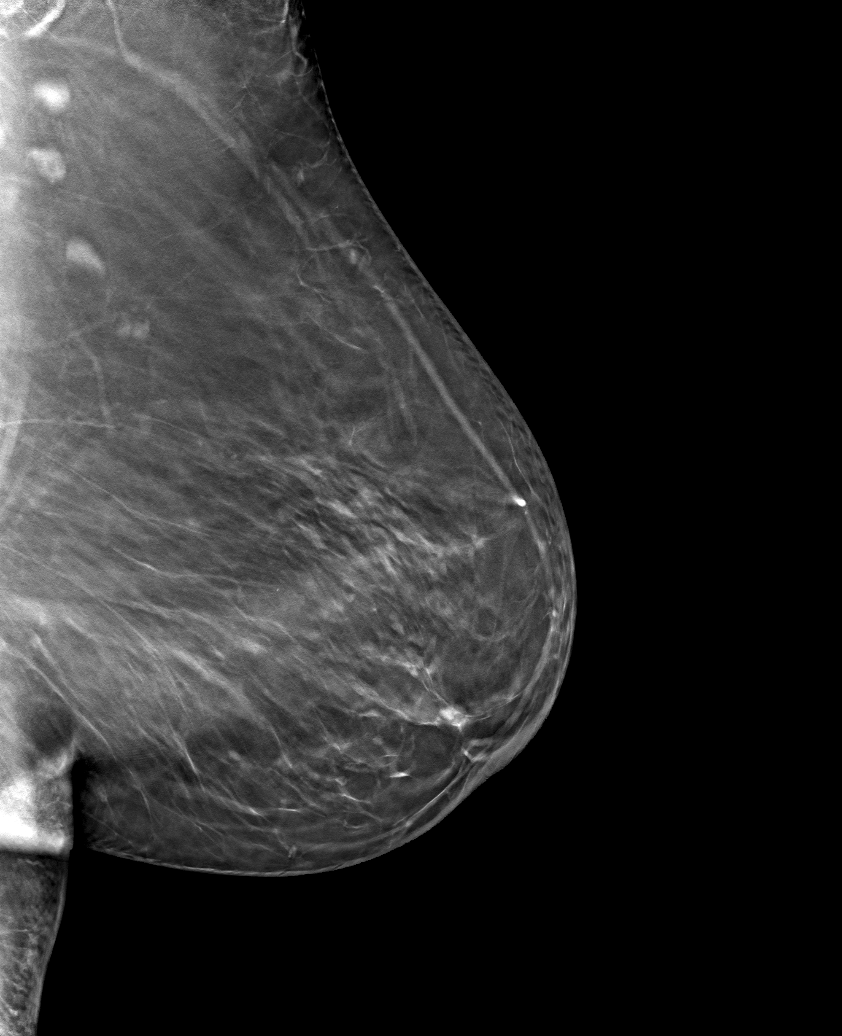

[6 of 30 positions shown; findings below may reference images not displayed]

ACR Breast Density Category b: There are scattered areas of
fibroglandular density.
FINDINGS: There are no findings suspicious for malignancy. Images were
processed with CAD.
IMPRESSION: No mammographic evidence of malignancy. A result letter of this
screening mammogram will be mailed directly to the patient.

RECOMMENDATION:
Screening mammogram in one year. (Code:[id number])

BI-RADS CATEGORY  1: Negative.

## 2022-02-21 DIAGNOSIS — M25512 Pain in left shoulder: Secondary | ICD-10-CM | POA: Diagnosis not present

## 2022-02-28 DIAGNOSIS — M25512 Pain in left shoulder: Secondary | ICD-10-CM | POA: Diagnosis not present

## 2022-03-02 DIAGNOSIS — M25512 Pain in left shoulder: Secondary | ICD-10-CM | POA: Diagnosis not present

## 2022-03-05 DIAGNOSIS — D0439 Carcinoma in situ of skin of other parts of face: Secondary | ICD-10-CM | POA: Diagnosis not present

## 2022-03-05 DIAGNOSIS — D485 Neoplasm of uncertain behavior of skin: Secondary | ICD-10-CM | POA: Diagnosis not present

## 2022-03-05 DIAGNOSIS — L821 Other seborrheic keratosis: Secondary | ICD-10-CM | POA: Diagnosis not present

## 2022-03-05 DIAGNOSIS — B353 Tinea pedis: Secondary | ICD-10-CM | POA: Diagnosis not present

## 2022-03-05 DIAGNOSIS — L304 Erythema intertrigo: Secondary | ICD-10-CM | POA: Diagnosis not present

## 2022-03-07 DIAGNOSIS — M25512 Pain in left shoulder: Secondary | ICD-10-CM | POA: Diagnosis not present

## 2022-03-14 DIAGNOSIS — M25512 Pain in left shoulder: Secondary | ICD-10-CM | POA: Diagnosis not present

## 2022-03-19 DIAGNOSIS — M25512 Pain in left shoulder: Secondary | ICD-10-CM | POA: Diagnosis not present

## 2022-07-19 DIAGNOSIS — D0439 Carcinoma in situ of skin of other parts of face: Secondary | ICD-10-CM | POA: Diagnosis not present

## 2022-07-19 DIAGNOSIS — L853 Xerosis cutis: Secondary | ICD-10-CM | POA: Diagnosis not present

## 2023-04-04 DIAGNOSIS — H2513 Age-related nuclear cataract, bilateral: Secondary | ICD-10-CM | POA: Diagnosis not present

## 2023-07-25 DIAGNOSIS — D2261 Melanocytic nevi of right upper limb, including shoulder: Secondary | ICD-10-CM | POA: Diagnosis not present

## 2023-07-25 DIAGNOSIS — L304 Erythema intertrigo: Secondary | ICD-10-CM | POA: Diagnosis not present

## 2023-07-25 DIAGNOSIS — D225 Melanocytic nevi of trunk: Secondary | ICD-10-CM | POA: Diagnosis not present

## 2023-07-25 DIAGNOSIS — Z85828 Personal history of other malignant neoplasm of skin: Secondary | ICD-10-CM | POA: Diagnosis not present

## 2023-07-25 DIAGNOSIS — D2262 Melanocytic nevi of left upper limb, including shoulder: Secondary | ICD-10-CM | POA: Diagnosis not present

## 2023-07-25 DIAGNOSIS — L853 Xerosis cutis: Secondary | ICD-10-CM | POA: Diagnosis not present

## 2023-07-25 DIAGNOSIS — D2272 Melanocytic nevi of left lower limb, including hip: Secondary | ICD-10-CM | POA: Diagnosis not present

## 2023-11-20 ENCOUNTER — Encounter: Payer: Self-pay | Admitting: Family Medicine

## 2023-11-20 ENCOUNTER — Ambulatory Visit (INDEPENDENT_AMBULATORY_CARE_PROVIDER_SITE_OTHER): Admitting: Family Medicine

## 2023-11-20 VITALS — BP 148/75 | HR 85 | Resp 20 | Ht 63.0 in | Wt 221.7 lb

## 2023-11-20 DIAGNOSIS — L304 Erythema intertrigo: Secondary | ICD-10-CM

## 2023-11-20 DIAGNOSIS — H9193 Unspecified hearing loss, bilateral: Secondary | ICD-10-CM | POA: Diagnosis not present

## 2023-11-20 DIAGNOSIS — M654 Radial styloid tenosynovitis [de Quervain]: Secondary | ICD-10-CM

## 2023-11-20 MED ORDER — NAPROXEN 500 MG PO TABS: 500.0000 mg | ORAL_TABLET | Freq: Two times a day (BID) | ORAL | 0 refills | Status: AC

## 2023-11-20 MED ORDER — KETOCONAZOLE 2 % EX CREA: 1.0000 | TOPICAL_CREAM | Freq: Every day | CUTANEOUS | 0 refills | Status: AC

## 2023-11-20 NOTE — Progress Notes (Signed)
 Established patient visit   Patient: Olivia [name]   DOB: 11/14/1945   78 y.o. Female  MRN: [id number] Visit Date: 11/20/2023  Today's healthcare provider: Nancyann Perry, MD   Chief Complaint  Patient presents with   Wrist Pain    Pulled solar blanket off pool and when pulling. Playing in pool with beach ball complains R thumb pain that radiates to wrist, soreness onset 2 weeks    Subjective    Discussed the use of AI scribe software for clinical note transcription with the patient, who gave verbal consent to proceed.  History of Present Illness   Olivia [name] is a 78 year old female with arthritis who presents with right thumb pain.  She has been experiencing pain in her right thumb for the past two weeks, which began after pulling a solar blanket at the pool and playing with a beach ball. The pain developed over a couple of days following these activities, with no specific injury or fall noted.  She manages the pain with intermittent use of ibuprofen and topical applications such as Vytorin and Salonpas, which provide some relief. She is cautious about using topical treatments at night due to concerns about accidental contact with her eyes.  She has a history of arthritis in her hands, attributed to past bowling activities. Her right hand is more frequently used due to a torn rotator cuff in her left shoulder, which limits her range of motion. She has received injections and physical therapy for her shoulder but has opted against surgery.  During the review of symptoms, the pain is primarily localized to the thumb and does not radiate. Discomfort is mainly when pressure is applied to the area, rather than during movement.  She also mentions a persistent rash in the groin area, described as redness and soreness, which has been treated in the past without complete resolution. She has tried various treatments, including ketoconazole , but the issue persists.        Medications: Outpatient Medications Prior to Visit  Medication Sig   Acetaminophen  (TYLENOL  PO) Take by mouth as needed.   ibuprofen (ADVIL,MOTRIN) 200 MG tablet Take 200 mg by mouth every 6 (six) hours as needed.   Multiple Vitamin (MULTIVITAMIN) capsule Take 1 capsule by mouth daily.    No facility-administered medications prior to visit.        Objective    BP (!) 148/75 (BP Location: Left Arm, Patient Position: Sitting, Cuff Size: Large)   Pulse 85   Resp 20   Ht 5' 3 (1.6 m)   Wt 221 lb 11.2 oz (100.6 kg)   SpO2 97%   BMI 39.27 kg/m   Physical Exam   General appearance: Mildly obese female, cooperative and in no acute distress Head: Normocephalic, without obvious abnormality, atraumatic Respiratory: Respirations even and unlabored, normal respiratory rate Extremities: Positive Finkelstein's test on right.  Skin: Skin color, texture, turgor normal. No rashes seen  Psych: Appropriate mood and affect. Neurologic: Mental status: Alert, oriented to person, place, and time, thought content appropriate.   Assessment & Plan       De Quervain's Tenosynovitis Right thumb pain likely due to De Quervain's tenosynovitis from increased right hand use. Partial relief with ibuprofen and topical treatments. Expected resolution with treatment. - Prescribe Naprosyn  (naproxen ) twice daily for 7-10 days. - Consider orthopedist referral if no improvement in two weeks.  Intertrigo - Prescribe ketoconazole  cream. - Evaluate during upcoming physical examination.  Hypertension Blood pressure  elevated at 147/unknown, equipment issues noted. Advised dietary and lifestyle modifications. - Reassess blood pressure during upcoming physical examination. - Advise reduced sodium intake and regular physical activity.  Hearing Loss Occasional hearing difficulty reported, no obstruction noted.  - Refer to audiologist for comprehensive hearing evaluation.  General Health  Maintenance Desires full physical examination since knee surgery. - Schedule complete physical examination with blood work.      Nancyann Perry, MD  [facility] [phone] (phone) [phone] (fax)  [facility]

## 2023-11-20 NOTE — Patient Instructions (Signed)
 Marland Kitchen  Please review the attached list of medications and notify my office if there are any errors.   . Please bring all of your medications to every appointment so we can make sure that our medication list is the same as yours.

## 2023-12-16 ENCOUNTER — Ambulatory Visit (INDEPENDENT_AMBULATORY_CARE_PROVIDER_SITE_OTHER): Admitting: Family Medicine

## 2023-12-16 ENCOUNTER — Encounter: Payer: Self-pay | Admitting: Family Medicine

## 2023-12-16 VITALS — BP 137/62 | HR 88 | Resp 16 | Ht 63.0 in | Wt 217.7 lb

## 2023-12-16 DIAGNOSIS — R5383 Other fatigue: Secondary | ICD-10-CM | POA: Diagnosis not present

## 2023-12-16 DIAGNOSIS — G4730 Sleep apnea, unspecified: Secondary | ICD-10-CM

## 2023-12-16 DIAGNOSIS — M170 Bilateral primary osteoarthritis of knee: Secondary | ICD-10-CM | POA: Diagnosis not present

## 2023-12-16 DIAGNOSIS — M654 Radial styloid tenosynovitis [de Quervain]: Secondary | ICD-10-CM

## 2023-12-16 DIAGNOSIS — Z1211 Encounter for screening for malignant neoplasm of colon: Secondary | ICD-10-CM | POA: Diagnosis not present

## 2023-12-16 DIAGNOSIS — Z1231 Encounter for screening mammogram for malignant neoplasm of breast: Secondary | ICD-10-CM

## 2023-12-16 DIAGNOSIS — Z0000 Encounter for general adult medical examination without abnormal findings: Secondary | ICD-10-CM | POA: Diagnosis not present

## 2023-12-16 DIAGNOSIS — R1084 Generalized abdominal pain: Secondary | ICD-10-CM

## 2023-12-16 DIAGNOSIS — R32 Unspecified urinary incontinence: Secondary | ICD-10-CM

## 2023-12-16 DIAGNOSIS — Z1159 Encounter for screening for other viral diseases: Secondary | ICD-10-CM | POA: Diagnosis not present

## 2023-12-16 DIAGNOSIS — L304 Erythema intertrigo: Secondary | ICD-10-CM | POA: Diagnosis not present

## 2023-12-16 DIAGNOSIS — Z136 Encounter for screening for cardiovascular disorders: Secondary | ICD-10-CM

## 2023-12-16 DIAGNOSIS — E2839 Other primary ovarian failure: Secondary | ICD-10-CM

## 2023-12-16 NOTE — Patient Instructions (Addendum)
 Please review the attached list of medications and notify my office if there are any errors.   Please call the [facility] [phone]) to schedule a routine screening mammogram.  I strongly recommend two doses of Shingrix (the shingles vaccine) separated by 2 to 6 months for adults age 79 years and older. I recommend checking with your insurance plan regarding coverage for this vaccine.   We can try generic version of Contrave to help you lose weight if all your labs come back normal.

## 2023-12-16 NOTE — Progress Notes (Signed)
 Complete physical exam   Patient: Olivia [name]   DOB: 05-Apr-1946   78 y.o. Female  MRN: [id number] Visit Date: 12/16/2023  Today's healthcare provider: Nancyann Perry, MD   Chief Complaint  Patient presents with   Annual Exam    Pattern of eating: no diet, but eats very well  Pattern of sleep: wakes up at night to urinate about 5-6 hours of sleep  Are you exercising: No   Last colonoscopy 2014   Follow-up    Wrist not improving since last visit. R wrist w pain radiates to thumb   Fatigue   Weight Check   Abdominal Pain    Stomach tightness for a while and has some bowel movements and has some relieve.   Subjective    Discussed the use of AI scribe software for clinical note transcription with the patient, who gave verbal consent to proceed.  History of Present Illness   Olivia [name] is a 78 year old female who presents for an annual physical exam.  She experiences chronic wrist pain c/w Dequairvans tendonitis that was initially managed with naproxen , which provided relief. However, the pain returned after discontinuing the medication. There is no known trauma to the wrist, but she acknowledges the possibility of minor unnoticed injury. The pain is localized and persistent.  She has a history of a torn rotator cuff and has been under the care of an orthopedic doctor at [facility]. She mentions previous knee replacement surgery and discussions about potential shoulder replacement, which she is hesitant about due to uncertain outcomes. She experiences limited range of motion in her shoulder, being able to raise her arm but not fully extend it.  She feels tired, attributing it to frequent nocturnal urination occurring every couple of hours. She wears a pad to monitor for leakage and acknowledges drinking more fluids than she should before bed. She has leakage throughout the day and is interested in seeing urologist to discuss treatment options.   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 She experiences pressure and discomfort in her abdomen, raising concerns about potential issues related to her previous surgery. Her last colonoscopy was seven years ago. During that procedure, a mass was identified, leading to the resection. She describes discomfort in her abdomen following an exercise class, with pain persisting longer than expected. She associates this with her past colon resection and is concerned about potential complications.  Her pain is on both sides of upper abdomen and mostly on left in lower abdomen.   She has a family history of breast cancer, with her sister and maternal aunt having had the disease. She is vigilant about breast health and has experienced intermittent achiness in one breast, which she attributes to seatbelt use. No lumps or sores have been noticed.  She experiences nasal drainage primarily on the left side, which is clear and occurs sporadically. This is inconvenient and sometimes affects her throat, impacting her ability to sing in the choir.  She reports redness and irritation in skin folds, which she attributes to sweating and weight. Previous treatments have provided temporary relief, but the issue recurs.  She notes thinning hair and inquires about recommendations for vitamins or supplements. She currently takes a multivitamin for women over fifty.  She does not engage in regular planned exercise but has started  attending a chair exercise class at her church. She would like medication to help lose weight but cannot afford GLP-1. She does have history of sleep apnea diagnoses many years ago but was not able to wear CPAP overnight.   Wt Readings from Last 10 Encounters:  12/16/23 217 lb 11.2 oz (98.7 kg)  11/20/23 221 lb 11.2 oz (100.6 kg)  12/31/18 203 lb (92.1 kg)  01/22/18  203 lb (92.1 kg)  06/07/17 222 lb (100.7 kg)  04/13/16 220 lb (99.8 kg)  04/13/16 217 lb 6 oz (98.6 kg)  02/14/15 216 lb (98 kg)  12/22/14 217 lb (98.4 kg)  07/14/13 201 lb (91.2 kg)           Past Medical History:  Diagnosis Date   Diverticulitis 02/20/2013   History of chicken pox    History of measles    History of mumps    Past Surgical History:  Procedure Laterality Date   ABDOMINAL HYSTERECTOMY  1976   inverted Uterus. No cancer   APPENDECTOMY     CHOLECYSTECTOMY  2003 ?   COLON SURGERY  05/15/13   Sigmoid colectomy for focal diverticulosis/diverticulitis with contained abscess.   COLONOSCOPY  01-09-13   Dr Luellen   FOOT SURGERY Right 2000   KNEE SURGERY Right 2004   arthroscopy of knee; torn meniscus   TOTAL KNEE ARTHROPLASTY Right 03/06/2019   Dr. Trine, [facility]   UPPER GI ENDOSCOPY  01-09-13   Dr Luellen. Antral biopsy with minimal gastritis   Social History   Socioeconomic History   Marital status: Single    Spouse name: Not on file   Number of children: 3   Years of education: Not on file   Highest education level: Some college, no degree  Occupational History   Occupation: Employed    Comment: Works part time; 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  Tobacco Use   Smoking status: Never   Smokeless tobacco: Never  Substance and Sexual Activity   Alcohol use: Yes    Alcohol/week: 0.0 standard drinks of alcohol    Comment: occasional use   Drug use: No   Sexual activity: Not on file  Other Topics Concern   Not on file  Social History Narrative   Not on file   Social Drivers of Health   Financial Resource Strain: Patient Declined (11/19/2023)   Overall Financial Resource Strain (CARDIA)    Difficulty of Paying Living Expenses: Patient declined  Food Insecurity: Patient Declined (11/19/2023)   Hunger Vital Sign    Worried About Running Out of Food in the Last Year: Patient declined    Ran Out of Food in the Last Year: Patient  declined  Transportation Needs: No Transportation Needs (11/19/2023)   PRAPARE - Administrator, Civil Service (Medical): No    Lack of Transportation (Non-Medical): No  Physical Activity: Unknown (11/19/2023)   Exercise Vital Sign    Days of Exercise per Week: Patient declined    Minutes of Exercise per Session: Not on file  Stress: Patient Declined (11/19/2023)   [employer] of Occupational Health - Occupational Stress Questionnaire    Feeling of Stress: Patient declined  Social Connections: Unknown (11/19/2023)   Social Connection and Isolation Panel    Frequency of Communication with Friends and Family: More than three times a week    Frequency of Social Gatherings with Friends and Family: Three times a week    Attends Religious Services: More than 4 times  per year    Active Member of Clubs or Organizations: Yes    Attends Banker Meetings: More than 4 times per year    Marital Status: Patient declined  Intimate Partner Violence: Not At Risk (11/20/2023)   Humiliation, Afraid, Rape, and Kick questionnaire    Fear of Current or Ex-Partner: No    Emotionally Abused: No    Physically Abused: No    Sexually Abused: No   Family Status  Relation Name Status   Father  Deceased   Mother  Deceased       old age   Daughter  Alive   Daughter  Alive   Daughter  Alive   Sister  (Not Specified)   Youth worker  (Not Specified)  No partnership data on file   Family History  Problem Relation Age of Onset   Alzheimer's disease Father    Dementia Father    Diabetes Daughter    Hypertension Daughter    Breast cancer Sister 65   Breast cancer Maternal Aunt 50   No Known Allergies  Patient Care Team: Gasper Nancyann BRAVO, MD as PCP - General (Family Medicine) Portia Fireman, OD as Consulting Physician (Optometry) Chrystie, Arin L, MD (Dermatology)   Medications: Outpatient Medications Prior to Visit  Medication Sig   Acetaminophen  (TYLENOL  PO) Take by mouth  as needed.   ibuprofen (ADVIL,MOTRIN) 200 MG tablet Take 200 mg by mouth every 6 (six) hours as needed.   ketoconazole  (NIZORAL ) 2 % cream Apply 1 Application topically daily.   Multiple Vitamin (MULTIVITAMIN) capsule Take 1 capsule by mouth daily.    No facility-administered medications prior to visit.    Review of Systems  Constitutional:  Positive for fatigue. Negative for appetite change, chills and fever.  Respiratory:  Negative for chest tightness and shortness of breath.   Cardiovascular:  Negative for chest pain and palpitations.  Gastrointestinal:  Negative for abdominal pain, nausea and vomiting.  Neurological:  Negative for dizziness and weakness.      Objective    BP 137/62 (BP Location: Left Arm, Patient Position: Sitting, Cuff Size: Large)   Pulse 88   Resp 16   Ht 5' 3 (1.6 m)   Wt 217 lb 11.2 oz (98.7 kg)   SpO2 97%   BMI 38.56 kg/m    Physical Exam  General Appearance:    Mildly obese female. Alert, cooperative, in no acute distress, appears stated age   Head:    Normocephalic, without obvious abnormality, atraumatic  Eyes:    PERRL, conjunctiva/corneas clear, EOM's intact, fundi    benign, both eyes  Ears:    Normal TM's and external ear canals, both ears  Nose:   Nares normal, septum deviated to left, mucosa normal, scant clear drainage.  Throat:   Lips, mucosa, and tongue normal; teeth and gums normal  Neck:   Supple, symmetrical, trachea midline, no adenopathy;    thyroid:  no enlargement/tenderness/nodules; no carotid   bruit or JVD  Back:     Symmetric, no curvature, ROM normal, no CVA tenderness  Lungs:     Clear to auscultation bilaterally, respirations unlabored  Chest Wall:    No tenderness or deformity   Heart:    Normal heart rate. Normal rhythm. No murmurs, rubs, or gallops.   Breast Exam:    deferred  Abdomen:     Soft, non-tender, bowel sounds active all four quadrants,    no masses, no organomegaly  Pelvic:    deferred  Extremities:    All extremities are intact. No cyanosis or edema  Pulses:   2+ and symmetric all extremities  Skin:   Skin color, texture, turgor normal, no rashes or lesions  Lymph nodes:   Cervical, supraclavicular, and axillary nodes normal  Neurologic:   CNII-XII intact, normal strength, sensation and reflexes    throughout       Last depression screening scores    12/16/2023    9:26 AM 11/20/2023    8:20 AM 10/19/2021    1:35 PM  PHQ 2/9 Scores  PHQ - 2 Score 2 1 0  PHQ- 9 Score 6 6 0   Last fall risk screening    12/16/2023    9:25 AM  Fall Risk   Falls in the past year? 1  Number falls in past yr: 0  Injury with Fall? 0   Last Audit-C alcohol use screening    11/19/2023   11:18 AM  Alcohol Use Disorder Test (AUDIT)  1. How often do you have a drink containing alcohol? 2  2. How many drinks containing alcohol do you have on a typical day when you are drinking? 0  3. How often do you have six or more drinks on one occasion? 0  AUDIT-C Score 2      Patient-reported   A score of 3 or more in women, and 4 or more in men indicates increased risk for alcohol abuse, EXCEPT if all of the points are from question 1     Assessment & Plan    Routine Health Maintenance and Physical Exam  Exercise Activities and Dietary recommendations  Goals      DIET - EAT MORE FRUITS AND VEGETABLES     Increase water intake     Starting 04/13/16, I will increase my water intake to 3 glasses a day.         Immunization History  Administered Date(s) Administered   PFIZER(Purple Top)SARS-COV-2 Vaccination 07/08/2019, 07/29/2019   Pneumococcal Conjugate-13 02/10/2014   Pneumococcal Polysaccharide-23 04/11/2011   Tdap 03/29/2010    Health Maintenance  Topic Date Due   Hepatitis C Screening  Never done   Zoster Vaccines- Shingrix (1 of 2) Never done   COVID-19 Vaccine (3 - Pfizer risk series) 08/26/2019   DTaP/Tdap/Td (2 - Td or Tdap) 03/29/2020   MAMMOGRAM  02/17/2022   Medicare Annual  Wellness (AWV)  10/20/2022   INFLUENZA VACCINE  11/29/2023   DEXA SCAN  04/07/2024   Pneumococcal Vaccine: 50+ Years  Completed   HPV VACCINES  Aged Out   Meningococcal B Vaccine  Aged Out   Colonoscopy  Discontinued    Discussed health benefits of physical activity, and encouraged her to engage in regular exercise appropriate for her age and condition.    2. Encounter for screening mammogram for malignant neoplasm of breast  - MM 3D SCREENING MAMMOGRAM BILATERAL BREAST; Future  3. Colon cancer screening  - Cologuard  4. Need for hepatitis C screening test  - Hepatitis C antibody  5. Encounter for special screening examination for cardiovascular disorder  - Lipid panel  6. Estrogen deficiency  - DG Bone Density; Future  7. Intertrigo Responded well to ketoconazole  but comes back when she stops. May continue to use ketoconazole  prn and may discuss other options with Dr. Isenstein.   8. De Quervain's tenosynovitis, right Had moderate relief with naprosyn , but pain has no returned to baseline. She is going to discuss with her orthopedist in [place] and  let me know if she needs referral to hand specialist.   9. Other fatigue  - CBC - Comprehensive metabolic panel with GFR - TSH - B12  10. Generalized abdominal pain History of what sounds like abdominal adhesions requiring surgical intervention.  - CT ABDOMEN PELVIS W CONTRAST; Future  11. Urinary incontinence, unspecified type  - Ambulatory referral to Urology  12. Obesity, morbid (HCC) Discussed Contrave and will consider trial of generic equivalents if labs are normal.   13. Sleep apnea, unspecified type Obesity comorbidity. Consider tirzepatide and oral medications nott effect for weight loss.   14. Arthritis of both knees Obesity comorbidity. May need further evaluation by orthopedics. Weight loss encouraged.              Nancyann Perry, MD  [facility] [phone]  (phone) [phone] (fax)  [facility]

## 2023-12-17 ENCOUNTER — Telehealth: Payer: Self-pay

## 2023-12-17 LAB — LIPID PANEL
Chol/HDL Ratio: 3.8 ratio (ref 0.0–4.4)
Cholesterol, Total: 188 mg/dL (ref 100–199)
HDL: 50 mg/dL (ref >39)
LDL Chol Calc (NIH): 102 mg/dL — ABNORMAL HIGH (ref 0–99)
Triglycerides: 211 mg/dL — ABNORMAL HIGH (ref 0–149)
VLDL Cholesterol Cal: 36 mg/dL (ref 5–40)

## 2023-12-17 LAB — CBC
Hematocrit: 43.9 % (ref 34.0–46.6)
Hemoglobin: 14.3 g/dL (ref 11.1–15.9)
MCH: 31.6 pg (ref 26.6–33.0)
MCHC: 32.6 g/dL (ref 31.5–35.7)
MCV: 97 fL (ref 79–97)
Platelets: 242 x10E3/uL (ref 150–450)
RBC: 4.53 x10E6/uL (ref 3.77–5.28)
RDW: 12.8 % (ref 11.7–15.4)
WBC: 6.6 x10E3/uL (ref 3.4–10.8)

## 2023-12-17 LAB — COMPREHENSIVE METABOLIC PANEL WITH GFR
ALT: 17 IU/L (ref 0–32)
AST: 18 IU/L (ref 0–40)
Albumin: 4.3 g/dL (ref 3.8–4.8)
Alkaline Phosphatase: 83 IU/L (ref 44–121)
BUN/Creatinine Ratio: 24 (ref 12–28)
BUN: 17 mg/dL (ref 8–27)
Bilirubin Total: 0.3 mg/dL (ref 0.0–1.2)
CO2: 22 mmol/L (ref 20–29)
Calcium: 10 mg/dL (ref 8.7–10.3)
Chloride: 106 mmol/L (ref 96–106)
Creatinine, Ser: 0.71 mg/dL (ref 0.57–1.00)
Globulin, Total: 2.5 g/dL (ref 1.5–4.5)
Glucose: 97 mg/dL (ref 70–99)
Potassium: 4.7 mmol/L (ref 3.5–5.2)
Sodium: 142 mmol/L (ref 134–144)
Total Protein: 6.8 g/dL (ref 6.0–8.5)
eGFR: 87 mL/min/1.73 (ref >59)

## 2023-12-17 LAB — HEPATITIS C ANTIBODY: Hep C Virus Ab: NONREACTIVE

## 2023-12-17 LAB — TSH: TSH: 1.6 u[IU]/mL (ref 0.450–4.500)

## 2023-12-17 LAB — VITAMIN B12: Vitamin B-12: 448 pg/mL (ref 232–1245)

## 2023-12-17 NOTE — Telephone Encounter (Signed)
 Copied from CRM [id number]. Topic: Clinical - Lab/Test Results >> Dec 17, 2023 11:16 AM Tonda B wrote: Reason for CRM: pt is calling to get results please [phone]

## 2023-12-18 ENCOUNTER — Ambulatory Visit: Payer: Self-pay | Admitting: Family Medicine

## 2023-12-18 ENCOUNTER — Encounter: Admitting: Family Medicine

## 2023-12-25 ENCOUNTER — Ambulatory Visit
Admission: RE | Admit: 2023-12-25 | Discharge: 2023-12-25 | Disposition: A | Discharge status: Home / Self Care | Source: Ambulatory Visit | Attending: Family Medicine | Admitting: Family Medicine

## 2023-12-25 DIAGNOSIS — R1084 Generalized abdominal pain: Secondary | ICD-10-CM | POA: Diagnosis not present

## 2023-12-25 DIAGNOSIS — K439 Ventral hernia without obstruction or gangrene: Secondary | ICD-10-CM | POA: Diagnosis not present

## 2023-12-25 DIAGNOSIS — R109 Unspecified abdominal pain: Secondary | ICD-10-CM | POA: Diagnosis not present

## 2023-12-25 DIAGNOSIS — K760 Fatty (change of) liver, not elsewhere classified: Secondary | ICD-10-CM | POA: Diagnosis not present

## 2023-12-25 DIAGNOSIS — Z1211 Encounter for screening for malignant neoplasm of colon: Secondary | ICD-10-CM | POA: Diagnosis not present

## 2023-12-25 DIAGNOSIS — K5730 Diverticulosis of large intestine without perforation or abscess without bleeding: Secondary | ICD-10-CM | POA: Diagnosis not present

## 2023-12-25 MED ORDER — IOHEXOL 300 MG/ML  SOLN
100.0000 mL | Freq: Once | INTRAMUSCULAR | Status: AC | PRN
Start: 2023-12-25 — End: 2023-12-25
  Administered 2023-12-25: 100 mL via INTRAVENOUS

## 2023-12-31 ENCOUNTER — Other Ambulatory Visit: Payer: Self-pay

## 2023-12-31 DIAGNOSIS — K439 Ventral hernia without obstruction or gangrene: Secondary | ICD-10-CM

## 2024-01-02 LAB — COLOGUARD: COLOGUARD: NEGATIVE

## 2024-01-16 ENCOUNTER — Encounter: Payer: Self-pay | Admitting: General Surgery

## 2024-01-16 ENCOUNTER — Ambulatory Visit: Payer: Self-pay | Admitting: General Surgery

## 2024-01-16 VITALS — BP 150/70 | HR 85 | Ht 63.0 in | Wt 219.0 lb

## 2024-01-16 DIAGNOSIS — R1032 Left lower quadrant pain: Secondary | ICD-10-CM | POA: Diagnosis not present

## 2024-01-16 DIAGNOSIS — K439 Ventral hernia without obstruction or gangrene: Secondary | ICD-10-CM

## 2024-01-16 DIAGNOSIS — M778 Other enthesopathies, not elsewhere classified: Secondary | ICD-10-CM | POA: Diagnosis not present

## 2024-01-16 DIAGNOSIS — M79641 Pain in right hand: Secondary | ICD-10-CM | POA: Diagnosis not present

## 2024-01-16 DIAGNOSIS — M1811 Unilateral primary osteoarthritis of first carpometacarpal joint, right hand: Secondary | ICD-10-CM | POA: Diagnosis not present

## 2024-01-16 NOTE — Patient Instructions (Signed)
 Follow-up with our office as needed.  Please call and ask to speak with a nurse if you develop questions or concerns.    Belly Hernia (Ventral Hernia): What to Know  A ventral hernia is a bulge of tissue from inside the belly that pushes through a weak area of the belly. Sometimes, the bulge may have tissue from the small intestine or the large intestine. Ventral hernias do not go away without surgery. There are several types of ventral hernias. You may have: A hernia at a place where surgery was done (incisional hernia). A hernia just above the belly button (epigastric or paraumbilical hernia) or at the belly button (umbilical hernia). These can happen because of heavy lifting or straining. A hernia that comes and goes (reducible hernia). It may be visible only when you lift or strain. This type of hernia can be pushed back into the belly. A hernia that traps belly tissue inside the hernia (incarcerated hernia). This hernia cannot be pushed back into the belly. A hernia that cuts off blood flow to the tissues inside the hernia (strangulation hernia). The tissues can start to die if this happens. This is a very painful bulge that cannot be pushed back into the belly. This type of hernia is a medical emergency. What are the causes? This condition happens when tissue in the belly pushes on a weak area in the muscles. What increases the risk? You're more likely to have this condition if: You are 60 years or older. You had belly surgery in the past. This is common if there was an infection after surgery. You had an injury to the belly. You often lift or push heavy objects. You have been pregnant several times. You have long-term (chronic) health conditions that put pressure in your belly. These include: Being overweight or obese. Having a buildup of fluid inside your belly (ascites). You throw up or cough over and over again. You have trouble pooping (constipation). You strain to poop or  pee. What are the signs or symptoms? The only symptom of a ventral hernia may be a painless bulge in the belly.  Reducible hernia may be visible only when you strain, cough, or lift. Other symptoms may include: Dull pain. A feeling of pressure. Symptoms of an incarcerated hernia may include: Tenderness at hernia site. Bloating. Throwing up or feeling like you may throw up. Trouble pooping or no pooping at all. Symptoms of a strangulated hernia may include: More pain. Throwing up or feeling like you may throw up. Pain when pressing on the hernia. The skin over the hernia turning red or purple. Trouble pooping. Blood in the poop. How is this diagnosed? This condition may be diagnosed based on: Your symptoms and medical history. A physical exam. You may be asked to cough or strain while standing. These actions increase the pressure inside your belly and force the hernia through the opening in your belly. Your health care provider may try to reduce the hernia by gently pushing the hernia back in. Imaging studies, such as an ultrasound or CT scan. How is this treated? This condition is treated with surgery. If you have a strangulated hernia, surgery is done as soon as possible. If your hernia is small and not incarcerated, you may be asked to lose some weight before surgery. Follow these instructions at home: Eat and drink only as you've been told. Lose weight, if told by your provider. You may have to avoid lifting. Ask your provider how much you can  safely lift. Avoid activities that increase pressure on your hernia. Take your medicines only as told. You may need to take steps to help treat or prevent trouble pooping (constipation), such as: Taking medicines to help you poop. Eating foods high in fiber, like beans, whole grains, and fresh fruits and vegetables. Drinking more fluids as told. Ask your provider if it's safe to drive or use machines while taking your medicine. Contact a  health care provider if: Your hernia gets larger or feels hard. Your hernia becomes painful. Get help right away if: Your hernia becomes very painful. You have pain along with any of these: Changes in skin color in the area of the hernia. Feeling like throwing up. Throwing up. Fever. These symptoms may be an emergency. Call 911 right away. Do not wait to see if the symptoms will go away. Do not drive yourself to the hospital. This information is not intended to replace advice given to you by your health care provider. Make sure you discuss any questions you have with your health care provider. Document Revised: 10/23/2022 Document Reviewed: 10/23/2022 Elsevier Patient Education  2024 [employer].

## 2024-01-16 NOTE — Progress Notes (Signed)
 SPORTS MEDICINE RETURN VISIT  ASSESSMENT AND PLAN    Diagnosis ICD-10-CM Associated Orders  1. Pain of right hand  M79.641 XR Hand 3 Or More Views Right    2. Arthritis of carpometacarpal (CMC) joint of right thumb  M18.11 Orthopedics DME Order    3. Right wrist tendonitis  M77.8 Orthopedics DME Order         Assessment & Plan Right thumb carpometacarpal (CMC) joint osteoarthritis with associated tendinitis Chronic osteoarthritis and tendinitis causing significant pain and functional limitation. Degenerative changes exacerbated by minor activities. - Order x-ray of the right thumb to assess joint condition severity. - Provide a thumb spica splint for joint support and tendinitis management. - Discussed corticosteroid injection for pain relief, contingent on x-ray results. - Continue Voltaren for symptomatic relief.  No follow-ups on file.  Procedure(s): [facility] Joint Injection: Right  Consent  After discussing the various treatment options for the condition, It was agreed that a corticosteroid injection would be the next step in treatment. The nature of and the indications for a corticosteroid and / or local anaesthetic injection were reviewed in detail with the patient today. The inherent risks of injection including infection, allergic reaction, increased pain, incomplete relief or temporary relief of symptoms, alterations of blood glucose levels requiring careful monitoring and treatment as indicated, tendon, ligament or articular cartilage rupture or degeneration, nerve injury, skin depigmentation, and/or fatty atrophy were discussed.  Procedure  After the risks and benefits of the procedure were explained,verbal consent was given, and a procedural time-out was performed. The [facility] joint and surrounding structures were visualized with ultrasound and the [facility] joint space was identified  The site for the injection was properly marked and prepped with Chlorhexadine solution.  The injection  site was anesthetized with ethyl chloride and 3cc of 1% Lidocaine with a 25 gauge 1.5 inch needle. Using ultrasound guidance, the [facility] was visualized and injected with 10 milligrams of Kenalog , 2.5 cc sterile saline and 1/2 cc of 0.5% Ropivicaine  using a sterile technique and a 25 gauge1.5 inch needle. During injection, there was unrestricted flow and care was taken not to inject corticosteroid into the skin or subcutaneous tissues. There were no complications during the procedure.  NEED FOR SONOGRAPHIC GUIDANCE  Given the complexity of this problem, the anatomic location of this structure, sonographic guidance is recommended to prevent injury to neurovascular structures and confirm accuracy of injection. The accuracy of doing these injections blind is poor and the benefit to the patient by using ultrasound guidance is significant to avoid complications.   Reference: American Medical Society for Sports Medicine (AMSSM) position statement: interventional musculoskeletal ultrasound in sports medicine. Garnetta ALEC Hurst MM, Adams E, Berkoff D, Concoff AL, [place]stone LELON Claudene DOROTHA Mallissa JINNY Sports Med. 2014 Oct 20. pii: [id number]. doi: 10.1136/[id number]  Post procedure a sterile band-aide was applied. Post-injection instructions were given regarding post-procedure care, when to follow up in clinic and what to expect from the procedure. The patient tolerated the injection well and was discharged without complication.     SUBJECTIVE   Chief Complaint:  Chief Complaint  Patient presents with  . Right Wrist - Pain    Right wrist pain and possible trigger fingers. Denies any injury. States the pain started in July. She saw her PCP in July and was given naproxen . That helped quite a bit but the pain has increased since she stopped taking it.    78 y.o. Olivia [name]   History of Present Illness Olivia Olivia [name]  is a 78 year old Olivia [name] who presents with persistent pain and limited function in her  right thumb.  She experiences persistent pain in her right thumb, particularly at the base near the carpometacarpal [facility]) joint. The pain is exacerbated by movements such as thumb abduction and external rotation, causing discomfort during daily activities like wiping herself or using a light switch. The pain has been consistent since its onset, with no identifiable trauma or injury.  She has been using naproxen  and applying Voltaren gel, which provides some relief. Despite the pain, there is no weakness in the thumb or pain radiating up the arm.  No falls or trauma. No weakness in the thumb, and no pain radiating up the arm. Pain with thumb abduction and external rotation, particularly when performing specific tasks.  She has a history of bowling, which she believes may have contributed to her symptoms.   Past Medical History: Past Medical History[1]   OBJECTIVE   Physical Exam: Vitals:  Wt Readings from Last 3 Encounters:  03/06/19 90.3 kg (199 lb)  03/02/19 91.9 kg (202 lb 11.2 oz)  12/23/18 90.8 kg (200 lb 1.6 oz)   Estimated body mass index is 36.99 kg/m as calculated from the following:   Height as of 03/06/19: 156.2 cm (5' 1.5).   Weight as of 03/06/19: 90.3 kg (199 lb). Gen: Well-appearing Olivia [name] in no acute distress MSK: Right hand Physical Exam INSPECTION: Point tenderness at the end of the radius, posterior side. Pain with thumb abduction. Crepitus in [facility] joint. Fluid around tendon. Tenderness at [facility] joint, crepitus, limited extension and abduction. Tenderness over radial styloid at first dorsal compartment, equivocal Finkelstein's test. Very limited thumb extension. No overlying skin changes, ecchymosis, edema, gross deformity, or effusion. PALPATION: Tenderness to palpation at the [facility] joint and radial styloid. RANGE OF MOTION: Limited and painful range of motion, particularly in thumb extension and abduction. STRENGTH: 5 out of 5 overall strength in all planes of  movement. SPECIAL TESTS: Equivocal Finkelstein's test. NEUROVASCULAR: Sensation intact to superficial and deep palpation. Neurovascularly intact, less than 2 sec capillary refill.   Imaging/other tests: X-rays today show severe CMC arthritis mild STT arthritis Results RADIOLOGY Thumb X-ray: Findings consistent with tenderness at the [facility] joint, notable crepitus, limited extension and abduction, tenderness over the radial styloid at the first dorsal compartment, equivocal Finkelstein's test (01/16/2024)     ADMINISTRATIVE   I have personally reviewed and interpreted the images (as available). Point-of-care ultrasound imaging is on file and stored in a permanent location (if performed). I have personally reviewed prior records and incorporated relevant information above (as available).  Note written with [employer].    MEDICAL DECISION MAKING (level of service defined by 2/3 elements)   Number/Complexity of Problems Addressed 1 undiagnosed new problem with uncertain prognosis (99204/99214)  Amount/Complexity of Data to be Reviewed/Analyzed Independent interpretation of a test performed by another physician/other qualified health care professional (99204/99214)  Risk of Complications/Morbidity/Mortality of Management Decision for MINOR Surgery (Including Injection) WITHOUT Risk Factors (99203/99213)   TIME   Total Time for E/M Services on the Date of Encounter Time-based coding not utilized for this encounter   CONSULTATION   Consultation services provided No   MODIFIER 25 (Significant, Separately Billable Evaluation and Management)   Documentation to ensure appropriate insurance payment for medically necessary work  Per the [employer] (rev. 04/30/2021) Chapter 13, Section B. Evaluation & Management (E&M) Services, paragraph 5:  "In general,  E&M services on the same date of service as the minor surgical  procedure are included in the payment for the procedure.However, a significant and separately identifiable E&M service unrelated to the decision to perform the minor surgical procedure is separately reportable with modifier 25. The E&M service and minor surgical procedure do not require different diagnoses."  Per the American Medical Association in Reporting CPT Modifier 25 [CPTAssistant (Online). 2023;33(11):1-12.] Page 1, Appropriate Use, paragraph 1:  "Modifier 25 is used to indicate that a patient's condition required a significant, separately identifiable evaluation and management (E/M) service above and beyond that associated with another procedure or service being reported by the same physician or other qualified health care professional [facility]) on the same date. This service must be above and beyond the other service provided or beyond the usual preoperative and postoperative care associated with the procedure or service that was performed on that same date, and it must be substantiated by documentation in the patient's record that satisfies the relevant criteria for the respective E/M service to be reported."  Per the American Medical Association in Reporting CPT Modifier 25 [CPTAssistant (Online). 2023;33(11):1-12.] Page 2, Considerations, bullet point 2 (Requires awareness of usual preoperative and postoperative services):  "Pre- and post-operative services typically associated with a procedure include the following and cannot be reported with a separate E/M services code:  Review of patient's relevant past medical history, Assessment of the problem area to be treated by surgical or other service, Formulation and explanation of the clinical diagnosis, Review and explanation of the procedure to the patient, family, or caregiver, Discussion of alternative treatments or diagnostic options, Obtaining informed consent, Providing postoperative care instructions, Discussion of any further treatment  and follow up after the procedure"  As the service provider for this encounter, I attest that the patient's condition required a significant, separately identifiable, medical necessary evaluation and management service in addition to the procedure performed on the same date of service. The evaluation and management service was above and beyond the usual preoperative and postoperative care associated with the procedure. The specific elements of the encounter that represent evaluation and management service above and beyond the usual preoperative and postoperative care associated with the procedure include, but are not limited to: Reviewed the patient's relevant past surgical history, social history and family history Reviewed the patient's medications Reviewed the patient's allergies Independently obtained history of present illness and relevant review of systems Independently reviewed laboratory, imaging and/or other data Independently synthesized history, physical examination, laboratory, imaging and/or other data to formulate a management plan including elements separate from the procedure itself and usual postprocedural care    PROCEDURES   Hand/UE Inj: R thumb CMC for osteoarthritis on 01/16/2024 10:40 AM Laterality: right Location: thumb Medications: 10 mg triamcinolone  acetonide 10 mg/mL      DME   DME ORDER: Dx: M18.11, M77.8, Arthritis of carpometacarpal (CMC) joint of right thumb, Right wrist tendonitis Location: [address] Laterality: Right Body Location: Hand/Wrist/Elbow Hand/Wrist/Elbow Orthotics: Wrist Thumb Spica  Room 28   , DME Upper Extremity,  , The patient was prescribed this orthosis to be used on the upper extremity for the purpose of reducing pain and providing support and protection., This order was created via procedure documentation            [1] Past Medical History: Diagnosis Date  . Arthritis   . Joint pain   . Osteoarthritis   . Tear of  meniscus of knee

## 2024-01-21 NOTE — Progress Notes (Signed)
 Patient ID: Olivia [name], female   DOB: 05/30/1945, 79 y.o.   MRN: [id number] CC: Ventral Hernia History of Present Illness Olivia [name] is a 78 y.o. female with PMH as below and past surgical history significant for laparoscopic sigmoid colectomy who presents in consultation for abdominal pain.  The patient reports that she has intermittent whole abdominal pain.  She says that she did have discomfort after an exercise class but the pain lasted for a while.  She says the pain is located across the top of her abdomen and that she also has pain in her left lower quadrant.  She denies any overlying skin changes, nausea or vomiting or signs of obstipation.  She had a CT scan which I personally reviewed that showed multiple small fat-containing hernias.  Past Medical History Past Medical History:  Diagnosis Date   Diverticulitis 02/20/2013   History of chicken pox    History of measles    History of mumps        Past Surgical History:  Procedure Laterality Date   ABDOMINAL HYSTERECTOMY  1976   inverted Uterus. No cancer   APPENDECTOMY     CHOLECYSTECTOMY  2003 ?   COLON SURGERY  05/15/13   Sigmoid colectomy for focal diverticulosis/diverticulitis with contained abscess.   COLONOSCOPY  01-09-13   Dr Luellen   FOOT SURGERY Right 2000   KNEE SURGERY Right 2004   arthroscopy of knee; torn meniscus   TOTAL KNEE ARTHROPLASTY Right 03/06/2019   Dr. Trine, [facility]   UPPER GI ENDOSCOPY  01-09-13   Dr Luellen. Antral biopsy with minimal gastritis    No Known Allergies  Current Outpatient Medications  Medication Sig Dispense Refill   Acetaminophen  (TYLENOL  PO) Take by mouth as needed.     ibuprofen (ADVIL,MOTRIN) 200 MG tablet Take 200 mg by mouth every 6 (six) hours as needed.     ketoconazole  (NIZORAL ) 2 % cream Apply 1 Application topically daily. 60 g 0   Multiple Vitamin (MULTIVITAMIN) capsule Take 1 capsule by mouth daily.      No current facility-administered medications  for this visit.    Family History Family History  Problem Relation Age of Onset   Alzheimer's disease Father    Dementia Father    Diabetes Daughter    Hypertension Daughter    Breast cancer Sister 19   Breast cancer Maternal Aunt 28       Social History Social History   Tobacco Use   Smoking status: Never    Passive exposure: Never   Smokeless tobacco: Never  Vaping Use   Vaping status: Never Used  Substance Use Topics   Alcohol use: Yes    Alcohol/week: 0.0 standard drinks of alcohol    Comment: occasional use   Drug use: No        ROS Full ROS of systems performed and is otherwise negative there than what is stated in the HPI  Physical Exam Blood pressure (!) 150/70, pulse 85, height 5' 3 (1.6 m), weight 219 lb (99.3 kg), SpO2 98%.  Alert and oriented x 3, no work of breathing room air, regular rate and rhythm, abdomen is soft, nontender nondistended, several fat-containing hernias appreciated along her midline.  Data Reviewed CT scan reviewed and there are multiple fat-containing ventral hernias.  I have personally reviewed the patient's imaging and medical records.    Assessment/Plan  Patient with several fat-containing hernias.  She is also having pain in her left lower quadrant which is remote  from the areas of her hernia.  I do not see any evidence to suggest anatomic causes of this on her CT scan.  Certainly some of her mid abdominal pain may be related to her hernias but she says that this pain is sparing.  I discussed with her that if she has any signs of obstruction or intractable pain then we would discuss possible surgical intervention.  However, at this time her BMI is too high to pursue elective operative intervention.  I encouraged her to lose weight and if she gets to a BMI of 35 then we can perform a repair.  We will have her follow-up in 6 months.  I did discuss warning signs of hernia with her that would prompt immediate return to the office or to  the emergency department.   A total of 47 minutes was spent reviewing the patient's chart, performing history physical and discussing treatment options with the patient   Jayson MALVA Endow

## 2024-01-23 ENCOUNTER — Other Ambulatory Visit

## 2024-01-29 ENCOUNTER — Other Ambulatory Visit

## 2024-02-19 ENCOUNTER — Ambulatory Visit
Admission: RE | Admit: 2024-02-19 | Discharge: 2024-02-19 | Disposition: A | Discharge status: Home / Self Care | Source: Ambulatory Visit | Attending: Family Medicine | Admitting: Family Medicine

## 2024-02-19 DIAGNOSIS — E2839 Other primary ovarian failure: Secondary | ICD-10-CM | POA: Diagnosis present

## 2024-02-19 DIAGNOSIS — Z1231 Encounter for screening mammogram for malignant neoplasm of breast: Secondary | ICD-10-CM | POA: Insufficient documentation

## 2024-02-19 DIAGNOSIS — Z780 Asymptomatic menopausal state: Secondary | ICD-10-CM | POA: Diagnosis not present

## 2024-04-27 ENCOUNTER — Ambulatory Visit: Admitting: Urology

## 2024-04-27 ENCOUNTER — Telehealth: Payer: Self-pay

## 2024-04-27 VITALS — BP 147/81 | HR 94 | Ht 63.0 in | Wt 220.0 lb

## 2024-04-27 DIAGNOSIS — R32 Unspecified urinary incontinence: Secondary | ICD-10-CM | POA: Diagnosis not present

## 2024-04-27 MED ORDER — MIRABEGRON ER 50 MG PO TB24: 50.0000 mg | ORAL_TABLET | Freq: Every day | ORAL | 11 refills | Status: DC

## 2024-04-27 NOTE — Progress Notes (Signed)
 "  04/27/2024 2:15 PM   Olivia [name], Olivia [name] [id number]  Referring provider: Gasper Nancyann BRAVO, MD [address] [place],  [place] [address]  Chief Complaint  Patient presents with   Urinary Incontinence    HPI: I was consulted to assess the patient's urinary incontinence.  She primarily has urge incontinence if she holds it too long.  No stress incontinence.  She has some dampness while she sleeping.  She wears 0-2 pads a day that are damp and has redness due to urine  Voids every 1-2 hours during the day and every 2 hours at night.  Sometimes she has ankle edema  She reports a good flow  Has had a hysterectomy  She saw a dermatologist for the rash in the groin 6 or 7 months ago.  She has tried a number of creams.  She finds it works for a while and then the benefit less than  No history of kidney stones bladder surgery or bladder infections.  No neurologic issues.  Bowel function normal   PMH: Past Medical History:  Diagnosis Date   Diverticulitis 02/20/2013   History of chicken pox    History of measles    History of mumps     Surgical History: Past Surgical History:  Procedure Laterality Date   ABDOMINAL HYSTERECTOMY  1976   inverted Uterus. No cancer   APPENDECTOMY     CHOLECYSTECTOMY  2003 ?   COLON SURGERY  05/15/13   Sigmoid colectomy for focal diverticulosis/diverticulitis with contained abscess.   COLONOSCOPY  01-09-13   Dr Luellen   FOOT SURGERY Right 2000   KNEE SURGERY Right 2004   arthroscopy of knee; torn meniscus   TOTAL KNEE ARTHROPLASTY Right 03/06/2019   Dr. Trine, [facility]   UPPER GI ENDOSCOPY  01-09-13   Dr Luellen. Antral biopsy with minimal gastritis    Home Medications:  Allergies as of 04/27/2024   No Known Allergies      Medication List        Accurate as of April 27, 2024  2:15 PM. If you have any questions, ask your nurse or doctor.          ibuprofen 200 MG tablet Commonly known as: ADVIL Take 200  mg by mouth every 6 (six) hours as needed.   ketoconazole  2 % cream Commonly known as: NIZORAL  Apply 1 Application topically daily.   multivitamin capsule Take 1 capsule by mouth daily.   TYLENOL  PO Take by mouth as needed.        Allergies: Allergies[1]  Family History: Family History  Problem Relation Age of Onset   Alzheimer's disease Father    Dementia Father    Diabetes Daughter    Hypertension Daughter    Breast cancer Sister 50   Breast cancer Maternal Aunt 10    Social History:  reports that she has never smoked. She has never been exposed to tobacco smoke. She has never used smokeless tobacco. She reports current alcohol use. She reports that she does not use drugs.  ROS:                                        Physical Exam: BP (!) 147/81   Pulse 94   Ht 5' 3 (1.6 m)   Wt 99.8 kg   SpO2 94%   BMI 38.97 kg/m   Constitutional:  Alert and  oriented, No acute distress. HEENT: [place] AT, moist mucus membranes.  Trachea midline, no masses. Cardiovascular: No clubbing, cyanosis, or edema. Respiratory: Normal respiratory effort, no increased work of breathing. GI: Abdomen is soft, nontender, nondistended, no abdominal masses GU: On pelvic examination patient had grade 1 hypermobility the bladder neck and negative cough test with light cough.  Exam was a little bit limited due to body habitus but no significant.  She did have mild erythema in both groins Skin: No rashes, bruises or suspicious lesions. Lymph: No cervical or inguinal adenopathy. Neurologic: Grossly intact, no focal deficits, moving all 4 extremities. Psychiatric: Normal mood and affect.  Laboratory Data: Lab Results  Component Value Date   WBC 6.6 12/16/2023   HGB 14.3 12/16/2023   HCT 43.9 12/16/2023   MCV 97 12/16/2023   PLT 242 12/16/2023    Lab Results  Component Value Date   CREATININE 0.71 12/16/2023    No results found for: PSA  No results found for:  TESTOSTERONE  No results found for: HGBA1C  Urinalysis No results found for: COLORURINE, APPEARANCEUR, LABSPEC, PHURINE, GLUCOSEU, HGBUR, BILIRUBINUR, KETONESUR, PROTEINUR, UROBILINOGEN, NITRITE, LEUKOCYTESUR  Pertinent Imaging:   Assessment & Plan: Patient primarily has urge incontinence and bedwetting with frequency and nocturia.  The redness in the patient's groins may be from body habitus and sweating and not necessarily incontinence.  Vulva and introitus was healthy.  Role of Mycolog cream discussed.  Can always see dermatology again.  May improve we can improve her continence level  Reassess on Myrbetriq  for pelvic examination and cystoscopy in 6 weeks.  No Gemtesa sent.   1. Urinary incontinence, unspecified type (Primary)   - Urinalysis, Complete   No follow-ups on file.  Glendia DELENA Elizabeth, MD  [facility] [address] [place], [place] [address] [phone]     [1] No Known Allergies  "

## 2024-04-27 NOTE — Patient Instructions (Signed)

## 2024-04-27 NOTE — Telephone Encounter (Signed)
 Copied from CRM #[id number]. Topic: Clinical - Medication Question >> Apr 27, 2024  1:56 PM Tiffini S wrote: Reason for CRM: Patient was prescribed medication for wrist arm, hand and wrist- believe the medicine was naxproen but unsure/ it was the last medication that was prescribed  Please send refill to:   [facility]  [address] [place] [address] Patient contact number is [phone]

## 2024-04-28 MED ORDER — NAPROXEN 500 MG PO TABS: 500.0000 mg | ORAL_TABLET | Freq: Two times a day (BID) | ORAL | 1 refills | Status: AC

## 2024-04-28 NOTE — Telephone Encounter (Signed)
 Pulled naprosyn  from med reconcile [facility]  Last Visit: 12/16/2023 Next Visit: 12/16/2024 Last Refill: Never filled by PCP  Please Advise

## 2024-05-06 ENCOUNTER — Telehealth: Payer: Self-pay

## 2024-05-06 NOTE — Telephone Encounter (Signed)
 Copied from CRM [id number]. Topic: General - Other >> May 06, 2024 10:19 AM Edsel HERO wrote: Grayce with [facility] called to advise provider that patient did not want an appointment with Audiology and that was what the referral was sent for. Please advise.

## 2024-05-07 ENCOUNTER — Telehealth: Payer: Self-pay

## 2024-05-07 DIAGNOSIS — J329 Chronic sinusitis, unspecified: Secondary | ICD-10-CM

## 2024-05-07 NOTE — Telephone Encounter (Signed)
 Copied from CRM #[id number]. Topic: Referral - Question >> May 07, 2024 11:00 AM Sophia H wrote: Reason for CRM: Need updated referral for ENT - need it to state that patient has a lot of drainage, doesn't matter what she is doing. Patient believed it was just allergies before but it is not that season anymore and still dealing with this. States ENT won't see her for that without the updated referral. Please advise # [phone]

## 2024-05-11 NOTE — Addendum Note (Signed)
 Addended by: GASPER NANCYANN BRAVO on: 05/11/2024 01:08 PM   Modules accepted: Orders

## 2024-05-12 ENCOUNTER — Telehealth: Payer: Self-pay

## 2024-05-12 NOTE — Telephone Encounter (Signed)
PA sent through cover my meds. Waiting on response.

## 2024-05-29 ENCOUNTER — Telehealth: Payer: Self-pay | Admitting: Family Medicine

## 2024-05-29 NOTE — Telephone Encounter (Signed)
 Copied from CRM #[id number]. Topic: Referral - Question >> May 29, 2024 12:01 PM [name] wrote: Reason for CRM: Patient contacted [facility] to schedule from the referral that was submitted, they advised they are unable to schedule without the referral going through [facility].   Patient can be reached at [phone]

## 2024-06-03 ENCOUNTER — Encounter: Payer: Self-pay | Admitting: Urology

## 2024-06-03 DIAGNOSIS — R32 Unspecified urinary incontinence: Secondary | ICD-10-CM

## 2024-06-05 ENCOUNTER — Telehealth: Payer: Self-pay

## 2024-06-05 MED ORDER — OXYBUTYNIN CHLORIDE ER 10 MG PO TB24: 10.0000 mg | ORAL_TABLET | Freq: Every day | ORAL | 11 refills | Status: AC

## 2024-06-05 NOTE — Telephone Encounter (Signed)
 Please review

## 2024-06-05 NOTE — Telephone Encounter (Signed)
 Copied from CRM [id number]. Topic: Referral - Status >> Jun 05, 2024 12:09 PM Delon DASEN wrote: Reason for CRM:  [facility]  still needs referral sent through [facility] for the nose and throat- they got the one for hearing only

## 2024-07-13 ENCOUNTER — Other Ambulatory Visit: Admitting: Urology

## 2024-12-16 ENCOUNTER — Encounter: Admitting: Family Medicine

## 2025-04-26 ENCOUNTER — Ambulatory Visit: Admitting: Urology
